# Patient Record
Sex: Female | Born: 1970
Health system: Southern US, Community
[De-identification: ages and names within clinical notes are randomized; demographics above are authoritative.]

## PROBLEM LIST (undated history)

## (undated) DIAGNOSIS — F419 Anxiety disorder, unspecified: Secondary | ICD-10-CM

## (undated) DIAGNOSIS — T7840XA Allergy, unspecified, initial encounter: Secondary | ICD-10-CM

## (undated) DIAGNOSIS — F329 Major depressive disorder, single episode, unspecified: Secondary | ICD-10-CM

## (undated) DIAGNOSIS — Z8619 Personal history of other infectious and parasitic diseases: Secondary | ICD-10-CM

## (undated) DIAGNOSIS — J449 Chronic obstructive pulmonary disease, unspecified: Secondary | ICD-10-CM

## (undated) DIAGNOSIS — F32A Depression, unspecified: Secondary | ICD-10-CM

## (undated) DIAGNOSIS — I10 Essential (primary) hypertension: Secondary | ICD-10-CM

## (undated) HISTORY — DX: Depression, unspecified: F32.A

## (undated) HISTORY — DX: Major depressive disorder, single episode, unspecified: F32.9

## (undated) HISTORY — PX: MYRINGOTOMY: SHX2060

## (undated) HISTORY — DX: Essential (primary) hypertension: I10

## (undated) HISTORY — DX: Anxiety disorder, unspecified: F41.9

## (undated) HISTORY — PX: BREAST ENHANCEMENT SURGERY: SHX7

## (undated) HISTORY — DX: Chronic obstructive pulmonary disease, unspecified: J44.9

## (undated) HISTORY — DX: Allergy, unspecified, initial encounter: T78.40XA

## (undated) HISTORY — PX: AUGMENTATION MAMMAPLASTY: SUR837

---

## 2002-09-02 ENCOUNTER — Emergency Department (HOSPITAL_COMMUNITY): Admission: EM | Admit: 2002-09-02 | Discharge: 2002-09-03 | Payer: Self-pay | Admitting: Emergency Medicine

## 2002-09-05 ENCOUNTER — Emergency Department (HOSPITAL_COMMUNITY): Admission: EM | Admit: 2002-09-05 | Discharge: 2002-09-05 | Payer: Self-pay | Admitting: Emergency Medicine

## 2002-09-10 ENCOUNTER — Emergency Department (HOSPITAL_COMMUNITY): Admission: EM | Admit: 2002-09-10 | Discharge: 2002-09-10 | Payer: Self-pay | Admitting: *Deleted

## 2007-03-13 ENCOUNTER — Inpatient Hospital Stay (HOSPITAL_COMMUNITY): Admission: EM | Admit: 2007-03-13 | Discharge: 2007-03-18 | Payer: Self-pay | Admitting: General Surgery

## 2007-03-13 ENCOUNTER — Encounter: Payer: Self-pay | Admitting: Emergency Medicine

## 2007-07-21 ENCOUNTER — Emergency Department (HOSPITAL_COMMUNITY): Admission: EM | Admit: 2007-07-21 | Discharge: 2007-07-21 | Payer: Self-pay | Admitting: Emergency Medicine

## 2010-06-27 NOTE — H&P (Signed)
[name], [name]                    ACCOUNT NO.:  [id number]   MEDICAL RECORD NO.:  [id number]          PATIENT TYPE:  EMS   LOCATION:  ED                           FACILITY:  [facility]   PHYSICIAN:  Wilmon Arms. Corliss Skains, M.D. DATE OF BIRTH:  10-Jul-1970   DATE OF ADMISSION:  03/12/2007  DATE OF DISCHARGE:                              HISTORY & PHYSICAL   CHIEF COMPLAINT:  Fall.  Splenic laceration.   BRIEF HISTORY:  The patient was initially evaluated at [facility]  emergency department and was emergently transferred to [facility] to the  trauma service.   The patient is a 40 year old female who apparently was out having some  drinks tonight.  She was walking outside when she fell and landed on her  left side.  She developed severe pain in her left posterior chest as  well as her abdomen.  She was evaluated in the emergency department.  She was hemodynamically stable throughout the entire  evaluation.  Her  hemoglobin was 13.8.  However, a CT scan showed hemoperitoneum with a  grade 3/4 splenic laceration.  She is being transferred to [facility]  for further management.   PAST MEDICAL HISTORY:  Hepatitis C.   PAST SURGICAL HISTORY:  C-section, bilateral tubal ligation and  bilateral breast implants.   MEDICATIONS:  Chantix and Wellbutrin.   ALLERGIES:  CODEINE.   SOCIAL HISTORY:  The patient smokes and drinks.   PHYSICAL EXAMINATION:  VITAL SIGNS:  Afebrile.  Heart rate 113, blood  pressure 146/92.  GENERAL:  This is a thin white female who appears uncomfortable.  HEENT:  EOMI.  Sclerae anicteric.  NECK:  No mass or thyromegaly.  LUNGS:  Clear.  Normal respiratory effort.  Tender to palpation in the  left posterior chest.  HEART:  Regular rate and rhythm.  ABDOMEN:  Positive bowel sounds.  Soft.  Minimal tenderness in the left  lateral upper abdomen and in the left flank.  No signs of abrasions.  EXTREMITIES:  No edema.  SKIN:  Warm and dry with no sign of jaundice.   Hemoglobin 13.8, white count 23.  Electrolytes within normal limits.   IMPRESSION:  Grade 3 splenic laceration.  The CT scan does not show any  active extravasation.   PLAN:  Transfer the patient to [facility] to the ICU for nonoperative  management.  We will check serial hemoglobins.  If there is any  significant change then she will need an emergent splenectomy.   ADDENDUM:  While I was coming in to evaluate the patient, apparently the  ED physician had ordered 2 units of packed red blood cells, which were  transfusing when I arrived.      Wilmon Arms. Tsuei, M.D.  Electronically Signed     MKT/MEDQ  D:  03/13/2007  T:  03/13/2007  Job:  [id number]
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
## 2010-07-19 ENCOUNTER — Ambulatory Visit
Admission: RE | Admit: 2010-07-19 | Discharge: 2010-07-19 | Disposition: A | Discharge status: Home / Self Care | Payer: Medicaid Other | Source: Ambulatory Visit | Attending: Specialist | Admitting: Specialist

## 2010-07-19 ENCOUNTER — Other Ambulatory Visit: Payer: Self-pay | Admitting: Specialist

## 2010-07-19 DIAGNOSIS — R200 Anesthesia of skin: Secondary | ICD-10-CM

## 2010-07-19 DIAGNOSIS — M542 Cervicalgia: Secondary | ICD-10-CM

## 2010-09-11 ENCOUNTER — Other Ambulatory Visit: Payer: Self-pay | Admitting: Specialist

## 2010-09-13 ENCOUNTER — Other Ambulatory Visit: Payer: Medicaid Other

## 2010-09-13 ENCOUNTER — Ambulatory Visit
Admission: RE | Admit: 2010-09-13 | Discharge: 2010-09-13 | Disposition: A | Discharge status: Home / Self Care | Payer: Medicaid Other | Source: Ambulatory Visit | Attending: Specialist | Admitting: Specialist

## 2010-11-02 LAB — CROSSMATCH

## 2010-11-02 LAB — CBC
HCT: 33.2 — ABNORMAL LOW
HCT: 33.8 — ABNORMAL LOW
HCT: 36.4
HCT: 39.9
Hemoglobin: 10.8 — ABNORMAL LOW
Hemoglobin: 11.5 — ABNORMAL LOW
Hemoglobin: 11.5 — ABNORMAL LOW
Hemoglobin: 11.6 — ABNORMAL LOW
Hemoglobin: 13.8
MCHC: 34.3
MCHC: 34.6
MCV: 90.4
MCV: 90.5
Platelets: 138 — ABNORMAL LOW
Platelets: 142 — ABNORMAL LOW
Platelets: 147 — ABNORMAL LOW
RBC: 3.4 — ABNORMAL LOW
RBC: 3.67 — ABNORMAL LOW
RDW: 12.4
RDW: 12.6
RDW: 13
WBC: 6.8
WBC: 6.9
WBC: 8.8
WBC: 24.7 — ABNORMAL HIGH

## 2010-11-02 LAB — BASIC METABOLIC PANEL
BUN: 2 — ABNORMAL LOW
CO2: 22
Chloride: 103
Chloride: 109
GFR calc Af Amer: >60
GFR calc non Af Amer: >60
Glucose, Bld: 95
Glucose, Bld: 159 — ABNORMAL HIGH
Potassium: 3.9
Potassium: 4.1
Potassium: 3.7
Sodium: 134 — ABNORMAL LOW
Sodium: 138
Sodium: 139

## 2010-11-02 LAB — RAPID URINE DRUG SCREEN, HOSP PERFORMED
Amphetamines: NOT DETECTED
Barbiturates: NOT DETECTED
Benzodiazepines: NOT DETECTED
Cocaine: NOT DETECTED

## 2010-11-02 LAB — DIFFERENTIAL
Eosinophils Relative: 0
Lymphocytes Relative: 6 — ABNORMAL LOW
Lymphs Abs: 1.6
Monocytes Absolute: 1.4 — ABNORMAL HIGH

## 2010-11-02 LAB — HEPATITIS C VIRUS, RIBA: HCV Antibodies-RIBA: POSITIVE

## 2010-11-02 LAB — ABO/RH: ABO/RH(D): A POS

## 2010-11-02 LAB — PROTIME-INR: Prothrombin Time: 14.4

## 2010-11-03 LAB — CBC
HCT: 34.1 — ABNORMAL LOW
HCT: 35.7 — ABNORMAL LOW
HCT: 38.6
MCV: 90.8
MCV: 91
Platelets: 184
Platelets: 221
RBC: 3.75 — ABNORMAL LOW
RDW: 12.5
WBC: 5.4
WBC: 5.4

## 2010-11-09 LAB — POCT I-STAT, CHEM 8
Calcium, Ion: 1.18
Chloride: 103
HCT: 46
Hemoglobin: 15.6 — ABNORMAL HIGH
TCO2: 26

## 2011-08-02 ENCOUNTER — Encounter: Payer: Self-pay | Admitting: *Deleted

## 2011-08-02 ENCOUNTER — Emergency Department
Admission: EM | Admit: 2011-08-02 | Discharge: 2011-08-02 | Disposition: A | Discharge status: Home / Self Care | Payer: Medicaid Other | Source: Home / Self Care | Attending: Emergency Medicine | Admitting: Emergency Medicine

## 2011-08-02 DIAGNOSIS — J069 Acute upper respiratory infection, unspecified: Secondary | ICD-10-CM

## 2011-08-02 DIAGNOSIS — R509 Fever, unspecified: Secondary | ICD-10-CM

## 2011-08-02 HISTORY — DX: Personal history of other infectious and parasitic diseases: Z86.19

## 2011-08-02 LAB — POCT URINALYSIS DIP (MANUAL ENTRY)
Blood, UA: NEGATIVE
Ketones, POC UA: NEGATIVE
Protein Ur, POC: NEGATIVE
Spec Grav, UA: 1.025 (ref 1.005–1.03)
pH, UA: 6 (ref 5–8)

## 2011-08-02 LAB — POCT RAPID STREP A (OFFICE): Rapid Strep A Screen: NEGATIVE

## 2011-08-02 MED ORDER — AZITHROMYCIN 250 MG PO TABS: ORAL_TABLET | ORAL | Status: AC

## 2011-08-02 NOTE — ED Provider Notes (Signed)
History     CSN: [id number]  Arrival date & time 08/02/11  1637   First MD Initiated Contact with Patient 08/02/11 1639      Chief Complaint  Patient presents with  . Generalized Body Aches  . Headache    (Consider location/radiation/quality/duration/timing/severity/associated sxs/prior treatment) HPI Cassy is a 41 y.o. female who complains of onset of cold symptoms for 2 days.  The symptoms are constant and mild-moderate in severity.  + Sick exposure while at work last week. + sore throat + cough No pleuritic pain No wheezing + nasal congestion + post-nasal drainage + sinus pain/pressure No chest congestion No itchy/red eyes + earache No hemoptysis No SOB + chills/sweats + fever No nausea No vomiting No abdominal pain No diarrhea No skin rashes + fatigue + myalgias + headache  + dysuria   Past Medical History  Diagnosis Date  . History of hepatitis C     Past Surgical History  Procedure Date  . Myringotomy     No family history on file.  History  Substance Use Topics  . Smoking status: Current Everyday Smoker  . Smokeless tobacco: Not on file  . Alcohol Use: No    OB History    Grav Para Term Preterm Abortions TAB SAB Ect Mult Living                  Review of Systems  All other systems reviewed and are negative.    Allergies  Review of patient's allergies indicates no known allergies.  Home Medications   Current Outpatient Rx  Name Route Sig Dispense Refill  . AZITHROMYCIN 250 MG PO TABS  Use as directed 1 each 0    BP 122/86  Pulse 66  Temp 98.2 F (36.8 C) (Oral)  Resp 14  Ht 5\' 1"  (1.549 m)  Wt 114 lb (51.71 kg)  BMI 21.54 kg/m2  SpO2 99%  Physical Exam  Nursing note and vitals reviewed. Constitutional: She is oriented to person, place, and time. She appears well-developed and well-nourished.  Non-toxic appearance. She has a sickly appearance. No distress.  HENT:  Head: Normocephalic and atraumatic.  Right Ear:  Tympanic membrane, external ear and ear canal normal.  Left Ear: Tympanic membrane, external ear and ear canal normal.  Nose: Mucosal edema and rhinorrhea present.  Mouth/Throat: Posterior oropharyngeal erythema present. No oropharyngeal exudate or posterior oropharyngeal edema.  Eyes: No scleral icterus.  Neck: Neck supple.  Cardiovascular: Regular rhythm and normal heart sounds.   Pulmonary/Chest: Effort normal and breath sounds normal. No respiratory distress. She has no decreased breath sounds. She has no wheezes. She has no rhonchi.  Neurological: She is alert and oriented to person, place, and time.  Skin: Skin is warm and dry.  Psychiatric: She has a normal mood and affect. Her speech is normal.    ED Course  Procedures (including critical care time)   Labs Reviewed  POCT RAPID STREP A (OFFICE)  POCT INFLUENZA A/B  POCT URINALYSIS DIP (MANUAL ENTRY)  URINE CULTURE   No results found.   1. Acute upper respiratory infections of unspecified site   2. Fever    Results for orders placed during the hospital encounter of 08/02/11  POCT RAPID STREP A (OFFICE)      Component Value Range   Rapid Strep A Screen Negative  Negative  POCT INFLUENZA A/B      Component Value Range   Influenza A, POC Negative     Influenza B, POC Negative  POCT URINALYSIS DIP (MANUAL ENTRY)      Component Value Range   Color, UA yellow     Clarity, UA clear     Glucose, UA neg     Bilirubin, UA negative     Bilirubin, UA negative     Spec Grav, UA 1.025  1.005 - 1.03   Blood, UA negative     pH, UA 6.0  5 - 8   Protein Ur, POC negative     Urobilinogen, UA 0.2  0 - 1   Nitrite, UA Negative     Leukocytes, UA Negative        MDM  1)  Rapid strep and flu tests are negative.  UA is as above.  UCx pending.  Rx for ABX given, but could hold for a few days since likely still just a viral syndrome. 2)  Use nasal saline solution (over the counter) at least 3 times a day. 3)  Use over the  counter decongestants like Zyrtec-D every 12 hours as needed to help with congestion.  If you have hypertension, do not take medicines with sudafed.  4)  Can take tylenol every 6 hours or motrin every 8 hours for pain or fever. 5)  Follow up with your primary doctor if no improvement in 5-7 days, sooner if increasing pain, fever, or new symptoms.         Marlaine Hind, MD 08/02/11 [phone]

## 2011-08-02 NOTE — ED Notes (Signed)
Patient c/o bodyaches, HA, cough (productive at times) and runny nose x 2 days.

## 2011-08-05 ENCOUNTER — Telehealth: Payer: Self-pay

## 2011-08-05 NOTE — ED Notes (Signed)
Left a message on voice mail asking how patient is feeling and advising to call back with any questions or concerns.  

## 2012-06-25 ENCOUNTER — Other Ambulatory Visit: Payer: Self-pay | Admitting: Obstetrics & Gynecology

## 2012-07-24 ENCOUNTER — Encounter: Payer: Self-pay | Admitting: Obstetrics & Gynecology

## 2012-12-01 ENCOUNTER — Emergency Department (HOSPITAL_COMMUNITY): Payer: Medicaid Other

## 2012-12-01 ENCOUNTER — Emergency Department (HOSPITAL_COMMUNITY)
Admission: EM | Admit: 2012-12-01 | Discharge: 2012-12-02 | Disposition: A | Discharge status: Home / Self Care | Payer: Medicaid Other | Attending: Emergency Medicine | Admitting: Emergency Medicine

## 2012-12-01 ENCOUNTER — Encounter (HOSPITAL_COMMUNITY): Payer: Self-pay | Admitting: Emergency Medicine

## 2012-12-01 DIAGNOSIS — F17200 Nicotine dependence, unspecified, uncomplicated: Secondary | ICD-10-CM | POA: Insufficient documentation

## 2012-12-01 DIAGNOSIS — N201 Calculus of ureter: Secondary | ICD-10-CM | POA: Insufficient documentation

## 2012-12-01 DIAGNOSIS — Z8619 Personal history of other infectious and parasitic diseases: Secondary | ICD-10-CM | POA: Insufficient documentation

## 2012-12-01 DIAGNOSIS — Z3202 Encounter for pregnancy test, result negative: Secondary | ICD-10-CM | POA: Insufficient documentation

## 2012-12-01 LAB — LIPASE, BLOOD: Lipase: 35 U/L (ref 11–59)

## 2012-12-01 LAB — URINALYSIS, ROUTINE W REFLEX MICROSCOPIC
Bilirubin Urine: NEGATIVE
Glucose, UA: NEGATIVE mg/dL
Hgb urine dipstick: NEGATIVE
Ketones, ur: 15 mg/dL — AB
Leukocytes, UA: NEGATIVE
Nitrite: NEGATIVE
Protein, ur: NEGATIVE mg/dL
Specific Gravity, Urine: 1.03 (ref 1.005–1.030)
Urobilinogen, UA: 0.2 mg/dL (ref 0.0–1.0)
pH: 5.5 (ref 5.0–8.0)

## 2012-12-01 LAB — URINE MICROSCOPIC-ADD ON

## 2012-12-01 LAB — CBC WITH DIFFERENTIAL/PLATELET
Basophils Absolute: 0 K/uL (ref 0.0–0.1)
Basophils Relative: 0 % (ref 0–1)
Eosinophils Absolute: 0 10*3/uL (ref 0.0–0.7)
Eosinophils Relative: 0 % (ref 0–5)
HCT: 43.1 % (ref 36.0–46.0)
Hemoglobin: 14.4 g/dL (ref 12.0–15.0)
Lymphocytes Relative: 35 % (ref 12–46)
Lymphs Abs: 2.8 10*3/uL (ref 0.7–4.0)
MCH: 29.6 pg (ref 26.0–34.0)
MCHC: 33.4 g/dL (ref 30.0–36.0)
MCV: 88.5 fL (ref 78.0–100.0)
Monocytes Absolute: 0.6 K/uL (ref 0.1–1.0)
Monocytes Relative: 7 % (ref 3–12)
Neutro Abs: 4.5 K/uL (ref 1.7–7.7)
Neutrophils Relative %: 57 % (ref 43–77)
Platelets: 254 10*3/uL (ref 150–400)
RBC: 4.87 MIL/uL (ref 3.87–5.11)
RDW: 12.8 % (ref 11.5–15.5)
WBC: 7.9 K/uL (ref 4.0–10.5)

## 2012-12-01 LAB — COMPREHENSIVE METABOLIC PANEL WITH GFR
Calcium: 9.6 mg/dL (ref 8.4–10.5)
Chloride: 105 meq/L (ref 96–112)
Creatinine, Ser: 0.84 mg/dL (ref 0.50–1.10)
GFR calc Af Amer: >90 mL/min (ref >90)
GFR calc non Af Amer: 85 mL/min — ABNORMAL LOW (ref >90)
Glucose, Bld: 106 mg/dL — ABNORMAL HIGH (ref 70–99)
Total Bilirubin: 0.3 mg/dL (ref 0.3–1.2)

## 2012-12-01 LAB — COMPREHENSIVE METABOLIC PANEL
ALT: 11 U/L (ref 0–35)
AST: 17 U/L (ref 0–37)
Albumin: 4.1 g/dL (ref 3.5–5.2)
Alkaline Phosphatase: 67 U/L (ref 39–117)
BUN: 11 mg/dL (ref 6–23)
CO2: 25 mEq/L (ref 19–32)
Potassium: 4.1 mEq/L (ref 3.5–5.1)
Sodium: 142 mEq/L (ref 135–145)
Total Protein: 7.9 g/dL (ref 6.0–8.3)

## 2012-12-01 LAB — POCT PREGNANCY, URINE: Preg Test, Ur: NEGATIVE

## 2012-12-01 MED ORDER — OXYCODONE-ACETAMINOPHEN 5-325 MG PO TABS
1.0000 | ORAL_TABLET | Freq: Once | ORAL | Status: AC
  Administered 2012-12-01: 1 via ORAL
  Filled 2012-12-01: qty 1

## 2012-12-01 MED ORDER — ONDANSETRON 4 MG PO TBDP
4.0000 mg | ORAL_TABLET | Freq: Three times a day (TID) | ORAL | Status: DC | PRN
Start: 2012-12-01 — End: 2013-08-09

## 2012-12-01 MED ORDER — HYDROMORPHONE HCL PF 1 MG/ML IJ SOLN
INTRAMUSCULAR | Status: AC
  Filled 2012-12-01: qty 1

## 2012-12-01 MED ORDER — ONDANSETRON HCL 4 MG/2ML IJ SOLN
4.0000 mg | Freq: Once | INTRAMUSCULAR | Status: AC
  Administered 2012-12-01: 4 mg via INTRAVENOUS

## 2012-12-01 MED ORDER — FENTANYL CITRATE 0.05 MG/ML IJ SOLN
50.0000 ug | Freq: Once | INTRAMUSCULAR | Status: AC
  Administered 2012-12-01: 50 ug via INTRAVENOUS

## 2012-12-01 MED ORDER — HYDROMORPHONE HCL PF 1 MG/ML IJ SOLN
0.5000 mg | Freq: Once | INTRAMUSCULAR | Status: AC
  Administered 2012-12-01: 0.5 mg via INTRAVENOUS
  Filled 2012-12-01: qty 1

## 2012-12-01 MED ORDER — OXYCODONE-ACETAMINOPHEN 5-325 MG PO TABS: 1.0000 | ORAL_TABLET | Freq: Four times a day (QID) | ORAL | Status: DC | PRN

## 2012-12-01 MED ORDER — KETOROLAC TROMETHAMINE 30 MG/ML IJ SOLN
30.0000 mg | Freq: Once | INTRAMUSCULAR | Status: AC
  Administered 2012-12-01: 30 mg via INTRAVENOUS
  Filled 2012-12-01: qty 1

## 2012-12-01 MED ORDER — HYDROMORPHONE HCL PF 1 MG/ML IJ SOLN
1.0000 mg | Freq: Once | INTRAMUSCULAR | Status: AC
  Administered 2012-12-01: 1 mg via INTRAVENOUS
  Administered 2012-12-01: 23:00:00 via INTRAVENOUS

## 2012-12-01 NOTE — ED Notes (Signed)
Patient transported to CT 

## 2012-12-01 NOTE — ED Notes (Signed)
MD in room

## 2012-12-01 NOTE — ED Notes (Signed)
Pt complaining on right flank pain radiating to her back for three days

## 2012-12-01 NOTE — ED Provider Notes (Signed)
CSN: [id number]     Arrival date & time 12/01/12  1945 History   First MD Initiated Contact with Patient 12/01/12 2042     Chief Complaint  Patient presents with  . Flank Pain   (Consider location/radiation/quality/duration/timing/severity/associated sxs/prior Treatment) Patient is a 42 y.o. female presenting with abdominal pain. The history is provided by the patient.  Abdominal Pain Pain location:  R flank Pain quality: sharp and stabbing   Pain radiates to:  Suprapubic region Pain severity:  Severe Onset quality:  Gradual Timing:  Intermittent Progression:  Waxing and waning Chronicity:  New Relieved by:  Nothing Worsened by:  Movement, palpation and urination Associated symptoms: dysuria   Associated symptoms: no anorexia, no chills, no constipation, no cough, no diarrhea, no fatigue, no fever, no hematemesis, no hematuria, no nausea, no shortness of breath, no vaginal bleeding, no vaginal discharge and no vomiting     Past Medical History  Diagnosis Date  . History of hepatitis C    Past Surgical History  Procedure Laterality Date  . Myringotomy     No family history on file. History  Substance Use Topics  . Smoking status: Current Every Day Smoker    Types: Cigarettes  . Smokeless tobacco: Not on file  . Alcohol Use: No   OB History   Grav Para Term Preterm Abortions TAB SAB Ect Mult Living                 Review of Systems  Constitutional: Negative for fever, chills and fatigue.  Respiratory: Negative for cough and shortness of breath.   Gastrointestinal: Positive for abdominal pain. Negative for nausea, vomiting, diarrhea, constipation, anorexia and hematemesis.  Genitourinary: Positive for dysuria and flank pain. Negative for frequency, hematuria, vaginal bleeding, vaginal discharge, difficulty urinating and genital sores.  Musculoskeletal: Negative for arthralgias, myalgias, neck pain and neck stiffness.  Skin: Negative for color change, pallor, rash and  wound.  All other systems reviewed and are negative.    Allergies  Review of patient's allergies indicates no known allergies.  Home Medications   Current Outpatient Rx  Name  Route  Sig  Dispense  Refill  . ondansetron (ZOFRAN ODT) 4 MG disintegrating tablet   Oral   Take 1 tablet (4 mg total) by mouth every 8 (eight) hours as needed for nausea.   10 tablet   0   . oxyCODONE-acetaminophen (PERCOCET/ROXICET) 5-325 MG per tablet   Oral   Take 1-2 tablets by mouth every 6 (six) hours as needed for pain.   30 tablet   0    BP 150/71  Pulse 55  Temp(Src) 98.9 F (37.2 C) (Oral)  Resp 24  Ht 5\' 1"  (1.549 m)  Wt 115 lb (52.164 kg)  BMI 21.74 kg/m2  SpO2 99%  LMP 11/11/2012 Physical Exam  Nursing note and vitals reviewed. Constitutional: She is oriented to person, place, and time. She appears well-developed and well-nourished. She appears distressed.  Uncomfortable appearing female in obvious pain  HENT:  Head: Normocephalic and atraumatic.  Mouth/Throat: Oropharynx is clear and moist. No oropharyngeal exudate.  Eyes: Conjunctivae and EOM are normal. Pupils are equal, round, and reactive to light.  Neck: Normal range of motion. Neck supple.  Cardiovascular: Normal rate, regular rhythm and normal heart sounds.  Exam reveals no gallop and no friction rub.   No murmur heard. Pulmonary/Chest: Effort normal and breath sounds normal. No respiratory distress. She has no wheezes. She has no rales. She exhibits no tenderness.  Abdominal: Soft. She exhibits no distension. There is no hepatosplenomegaly. There is tenderness (and right flank) in the right lower quadrant. There is CVA tenderness (R). There is no rigidity, no rebound, no guarding, no tenderness at McBurney's point and negative Murphy's sign.  Musculoskeletal: Normal range of motion. She exhibits no edema and no tenderness.  Lymphadenopathy:    She has no cervical adenopathy.  Neurological: She is alert and oriented to  person, place, and time.  Skin: Skin is warm and dry. No rash noted. She is not diaphoretic.  Psychiatric: She has a normal mood and affect. Her behavior is normal. Judgment and thought content normal.    ED Course  Procedures (including critical care time) Labs Review Labs Reviewed  COMPREHENSIVE METABOLIC PANEL - Abnormal; Notable for the following:    Glucose, Bld 106 (*)    GFR calc non Af Amer 85 (*)    All other components within normal limits  URINALYSIS, ROUTINE W REFLEX MICROSCOPIC - Abnormal; Notable for the following:    Color, Urine AMBER (*)    APPearance TURBID (*)    Ketones, ur 15 (*)    All other components within normal limits  URINE MICROSCOPIC-ADD ON - Abnormal; Notable for the following:    Squamous Epithelial / LPF MANY (*)    Crystals CA OXALATE CRYSTALS (*)    All other components within normal limits  CBC WITH DIFFERENTIAL  LIPASE, BLOOD  POCT PREGNANCY, URINE   Imaging Review Ct Abdomen Pelvis Wo Contrast  12/01/2012   CLINICAL DATA:  Right-sided flank pain radiating to the back for 3 days  EXAM: CT ABDOMEN AND PELVIS WITHOUT CONTRAST  TECHNIQUE: Multidetector CT imaging of the abdomen and pelvis was performed following the standard protocol without intravenous contrast.  COMPARISON:  07/21/2007  FINDINGS: Breast implants partly visualized. Motion artifact noted at the lung bases. Lung bases are clear. No pleural effusion.  Right-sided extrarenal pelvis is reidentified. There is new moderate right hydroureteronephrosis to the level of the pelvis. The distal right ureter is not well visualized due to lack of contrast, adjacent loops of unopacified bowel, and paucity of fat, however there is a new 2 mm radiopacity within the right pelvis image 64 that could correspond to the course of the distal right ureter and could represent a radiopaque calculus. No similar finding was seen in this area on the prior exam. There is also a 2 mm radiopacity in the right lateral  pelvis image 66, however this is felt to more likely represent a pelvic phlebolith due to its location.  Unenhanced liver, spleen, gallbladder, left kidney, adrenal glands, and pancreas are normal. No ascites or lymphadenopathy. The bowel is unremarkable. The appendix is not identified but there is no secondary evidence for acute appendicitis. Uterus is ovaries are normal. No acute osseous abnormality.  IMPRESSION: 2 mm probable right distal ureteral calculus image 64, with moderate proximal right hydroureteronephrosis. This is difficult to distinguish from adjacent probable phleboliths due to incomplete bladder decompression and lack of contrast, however there is no abnormal radiopacity greater than 2 mm in the pelvis, and a calculus of this size would most likely pass spontaneously.  No other abnormality is seen to account for the patient's right-sided flank pain.   Electronically Signed   By: Christiana Pellant M.D.   On: 12/01/2012 22:02    EKG Interpretation   None       MDM   1. Ureterolithiasis     42 year old female with no significant  past medical history who presents with 3 days of worsening right flank and right lower quadrant abdominal pain. Initially, patient felt this was likely a urinary tract infection and she took some antibiotics that she had leftover from a previous urinary tract infection. Pain did not resolve, so she decided to presents today. Has not taken anything for pain home. Denies fever, nausea, vomiting, vaginal bleeding, vaginal discharge, vaginal pain. No history of kidney stones.  Tenderness in the right flank, right CVA area. Waxing and waning pain radiating to the right groin typical of nephrolithiasis. Abdominal exam benign and no signs of obstruction appendicitis, torsion, TOA. Urinalysis obtained in triage shows Ca oxalate crystals but no signs of infection. No blood and no signs of infection. Normal white count and normal renal function. Urine pregnancy test  negative. Based on these findings, CT noncontrasted scan obtained to evaluate for stone and location and size. 2 mm stone found in the distal right ureter. Based on location and size, so will likely pass on its. Pain treated multiple different times in the emergency department with narcotics as well as Toradol. Patient will go home with Percocet, Zofran, urine strainer. She will be referred to urology as an outpatient for further evaluation and treatment.  Discussed with the patient return precautions and need for follow up with Urology. Patient voiced understanding. Stable for d/c. This patient was discussed with my attending, Dr. Oletta Lamas.   Dorna Leitz, MD 12/02/12 Marlyne Beards

## 2012-12-01 NOTE — ED Notes (Signed)
MD at bedside. 

## 2012-12-02 NOTE — ED Provider Notes (Signed)
I saw and evaluated the patient, reviewed the resident's note and I agree with the findings and plan.  Pt with acute flank pain, CT shows ureteral stone. Symptoms required multiple doses of analgesics to be comfortable.  No signs of infection.  HCG neg.  Pt can follow up with urology if needed, given 2 mm size, had made its way to distal ureteral, likely will pass on its own.    Gavin Pound. Phiona Ramnauth, MD 12/02/12 0005

## 2013-08-09 ENCOUNTER — Encounter (HOSPITAL_COMMUNITY): Payer: Self-pay | Admitting: Emergency Medicine

## 2013-08-09 ENCOUNTER — Emergency Department (HOSPITAL_COMMUNITY)
Admission: EM | Admit: 2013-08-09 | Discharge: 2013-08-10 | Disposition: A | Discharge status: Home / Self Care | Payer: No Typology Code available for payment source | Attending: Emergency Medicine | Admitting: Emergency Medicine

## 2013-08-09 DIAGNOSIS — S0993XA Unspecified injury of face, initial encounter: Secondary | ICD-10-CM | POA: Insufficient documentation

## 2013-08-09 DIAGNOSIS — Z3202 Encounter for pregnancy test, result negative: Secondary | ICD-10-CM | POA: Diagnosis not present

## 2013-08-09 DIAGNOSIS — S79919A Unspecified injury of unspecified hip, initial encounter: Secondary | ICD-10-CM | POA: Diagnosis not present

## 2013-08-09 DIAGNOSIS — R059 Cough, unspecified: Secondary | ICD-10-CM

## 2013-08-09 DIAGNOSIS — Y92410 Unspecified street and highway as the place of occurrence of the external cause: Secondary | ICD-10-CM | POA: Diagnosis not present

## 2013-08-09 DIAGNOSIS — S0990XA Unspecified injury of head, initial encounter: Secondary | ICD-10-CM | POA: Insufficient documentation

## 2013-08-09 DIAGNOSIS — N739 Female pelvic inflammatory disease, unspecified: Secondary | ICD-10-CM | POA: Insufficient documentation

## 2013-08-09 DIAGNOSIS — S79929A Unspecified injury of unspecified thigh, initial encounter: Secondary | ICD-10-CM

## 2013-08-09 DIAGNOSIS — R05 Cough: Secondary | ICD-10-CM | POA: Diagnosis not present

## 2013-08-09 DIAGNOSIS — Y9389 Activity, other specified: Secondary | ICD-10-CM | POA: Diagnosis not present

## 2013-08-09 DIAGNOSIS — S199XXA Unspecified injury of neck, initial encounter: Secondary | ICD-10-CM

## 2013-08-09 DIAGNOSIS — Z8619 Personal history of other infectious and parasitic diseases: Secondary | ICD-10-CM | POA: Diagnosis not present

## 2013-08-09 DIAGNOSIS — N898 Other specified noninflammatory disorders of vagina: Secondary | ICD-10-CM | POA: Diagnosis present

## 2013-08-09 DIAGNOSIS — M545 Low back pain, unspecified: Secondary | ICD-10-CM

## 2013-08-09 DIAGNOSIS — F17200 Nicotine dependence, unspecified, uncomplicated: Secondary | ICD-10-CM | POA: Diagnosis not present

## 2013-08-09 DIAGNOSIS — IMO0002 Reserved for concepts with insufficient information to code with codable children: Secondary | ICD-10-CM | POA: Insufficient documentation

## 2013-08-09 DIAGNOSIS — N730 Acute parametritis and pelvic cellulitis: Secondary | ICD-10-CM

## 2013-08-09 DIAGNOSIS — M25552 Pain in left hip: Secondary | ICD-10-CM

## 2013-08-09 DIAGNOSIS — R51 Headache: Secondary | ICD-10-CM

## 2013-08-09 DIAGNOSIS — R519 Headache, unspecified: Secondary | ICD-10-CM

## 2013-08-09 MED ORDER — OXYCODONE-ACETAMINOPHEN 5-325 MG PO TABS
1.0000 | ORAL_TABLET | Freq: Once | ORAL | Status: DC
  Filled 2013-08-09: qty 1

## 2013-08-09 NOTE — ED Notes (Signed)
Pt states she has been having abnormal periods and states she has noticed that she has a vaginal odor and would like to have that checked while she is here
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
## 2013-08-10 ENCOUNTER — Emergency Department (HOSPITAL_COMMUNITY): Payer: No Typology Code available for payment source

## 2013-08-10 DIAGNOSIS — S0990XA Unspecified injury of head, initial encounter: Secondary | ICD-10-CM | POA: Diagnosis not present

## 2013-08-10 LAB — WET PREP, GENITAL
Trich, Wet Prep: NONE SEEN
Yeast Wet Prep HPF POC: NONE SEEN

## 2013-08-10 LAB — URINALYSIS, ROUTINE W REFLEX MICROSCOPIC
BILIRUBIN URINE: NEGATIVE
Glucose, UA: NEGATIVE mg/dL
Hgb urine dipstick: NEGATIVE
KETONES UR: NEGATIVE mg/dL
Nitrite: NEGATIVE
Protein, ur: NEGATIVE mg/dL
Specific Gravity, Urine: 1.016 (ref 1.005–1.030)
UROBILINOGEN UA: 1 mg/dL (ref 0.0–1.0)
pH: 6.5 (ref 5.0–8.0)

## 2013-08-10 LAB — RPR

## 2013-08-10 LAB — PREGNANCY, URINE: Preg Test, Ur: NEGATIVE

## 2013-08-10 LAB — URINE MICROSCOPIC-ADD ON

## 2013-08-10 LAB — GC/CHLAMYDIA PROBE AMP
CT PROBE, AMP APTIMA: NEGATIVE
GC PROBE AMP APTIMA: NEGATIVE

## 2013-08-10 LAB — HIV ANTIBODY (ROUTINE TESTING W REFLEX): HIV 1&2 Ab, 4th Generation: NONREACTIVE

## 2013-08-10 MED ORDER — DOXYCYCLINE HYCLATE 100 MG PO CAPS: 100.0000 mg | ORAL_CAPSULE | Freq: Two times a day (BID) | ORAL | Status: DC

## 2013-08-10 MED ORDER — METHOCARBAMOL 500 MG PO TABS
1000.0000 mg | ORAL_TABLET | Freq: Once | ORAL | Status: AC
  Administered 2013-08-10: 1000 mg via ORAL
  Filled 2013-08-10: qty 2

## 2013-08-10 MED ORDER — AZITHROMYCIN 1 G PO PACK
1.0000 g | PACK | Freq: Once | ORAL | Status: AC
  Administered 2013-08-10: 1 g via ORAL
  Filled 2013-08-10: qty 1

## 2013-08-10 MED ORDER — NAPROXEN 500 MG PO TABS: 500.0000 mg | ORAL_TABLET | Freq: Two times a day (BID) | ORAL | Status: DC

## 2013-08-10 MED ORDER — DEXTROSE 5 % IV SOLN
250.0000 mg | Freq: Once | INTRAVENOUS | Status: AC
Start: 2013-08-10 — End: 2013-08-10
  Administered 2013-08-10: 250 mg via INTRAVENOUS
  Filled 2013-08-10: qty 250

## 2013-08-10 MED ORDER — METHOCARBAMOL 750 MG PO TABS: 750.0000 mg | ORAL_TABLET | Freq: Four times a day (QID) | ORAL | Status: DC

## 2013-08-10 MED ORDER — ONDANSETRON 8 MG PO TBDP: 8.0000 mg | ORAL_TABLET | Freq: Three times a day (TID) | ORAL | Status: DC | PRN

## 2013-08-10 MED ORDER — FENTANYL CITRATE 0.05 MG/ML IJ SOLN
50.0000 ug | Freq: Once | INTRAMUSCULAR | Status: AC
  Administered 2013-08-10: 50 ug via INTRAVENOUS
  Filled 2013-08-10: qty 2
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
## 2013-08-10 NOTE — Discharge Instructions (Signed)
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 is muscle and boney aches and pains. These pains can occur in any part of the body. Your caregiver may treat you without knowing the cause of the pain. They may treat you if blood or urine tests, X-rays, and other tests were normal.  CAUSES There is often not a definite cause or reason for these pains. These pains may be caused by a type of germ (virus). The discomfort may also come from overuse. Overuse includes working out too hard when your body is not fit. Boney aches also come from weather changes. Bone is sensitive to atmospheric pressure changes. HOME CARE INSTRUCTIONS   Ask when your test results will be ready. Make sure you get your test results.  Only take over-the-counter or prescription medicines for pain, discomfort, or fever as directed by your caregiver. If you were given medications for your condition, do not drive, operate machinery or power tools, or sign legal  documents for 24 hours. Do not drink alcohol. Do not take sleeping pills or other medications that may interfere with treatment.  Continue all activities unless the activities cause more pain. When the pain lessens, slowly resume normal activities. Gradually increase the intensity and duration of the activities or exercise.  During periods of severe pain, bed rest may be helpful. Lay or sit in any position that is comfortable.  Putting ice on the injured area.  Put ice in a bag.  Place a towel between your skin and the bag.  Leave the ice on for 15 to 20 minutes, 3 to 4 times a day.  Follow up with your caregiver for continued problems and no reason can be found for the pain. If the pain becomes worse or does not go away, it may be necessary to repeat tests or do additional testing. Your caregiver may need to look further for a possible cause. SEEK IMMEDIATE MEDICAL CARE IF:  You have pain that is getting worse and is not relieved by medications.  You develop chest pain that is associated with shortness or breath, sweating, feeling sick to your stomach (nauseous), or throw up (vomit).  Your pain becomes localized to the abdomen.  You develop any new symptoms that seem different or that concern you. MAKE SURE YOU:   Understand these instructions.  Will watch your condition.  Will get help right away if you are not doing well or get worse. Document Released: 01/29/2005 Document Revised: 04/23/2011 Document Reviewed: 10/03/2012 [facility] Patient Information 2015 [place], [place]. This information is not intended to replace advice given to you by your health care provider. Make sure you discuss any questions you have with your health care provider.  Pelvic Inflammatory Disease Pelvic inflammatory disease (PID) refers to an infection in some or all of the female organs. The infection can be in the uterus, ovaries, fallopian tubes,  or the surrounding tissues in the pelvis. PID can cause  abdominal or pelvic pain that comes on suddenly (acute pelvic pain). PID is a serious infection because it can lead to lasting (chronic) pelvic pain or the inability to have children (infertile).  CAUSES  The infection is often caused by the normal bacteria found in the vaginal tissues. PID may also be caused by an infection that is spread during sexual contact. PID can also occur following:   The birth of a baby.   A miscarriage.   An abortion.   Major pelvic surgery.   The use of an intrauterine device (IUD).   A sexual assault.  RISK FACTORS Certain factors can put a person at higher risk for PID, such as:  Being younger than 25 years.  Being sexually active at [place] age.  Usingnonbarrier contraception.  Havingmultiple sexual partners.  Having sex with someone who has symptoms of a genital infection.  Using oral contraception. Other times, certain behaviors can increase the possibility of getting PID, such as:  Having sex during your period.  Using a vaginal douche.  Having an intrauterine device (IUD) in place. SYMPTOMS   Abdominal or pelvic pain.   Fever.   Chills.   Abnormal vaginal discharge.  Abnormal uterine bleeding.   Unusual pain shortly after finishing your period. DIAGNOSIS  Your caregiver will choose some of the following methods to make a diagnosis, such as:   Performinga physical exam and history. A pelvic exam typically reveals a very tender uterus and surrounding pelvis.   Ordering laboratory tests including a pregnancy test, blood tests, and urine test.  Orderingcultures of the vagina and cervix to check for a sexually transmitted infection (STI).  Performing an ultrasound.   Performing a laparoscopic procedure to look inside the pelvis.  TREATMENT   Antibiotic medicines may be prescribed and taken by mouth.   Sexual partners may be treated when the infection is caused by a sexually transmitted disease (STD).    Hospitalization may be needed to give antibiotics intravenously.  Surgery may be needed, but this is rare. It may take weeks until you are completely well. If you are diagnosed with PID, you should also be checked for human immunodeficiency virus (HIV). HOME CARE INSTRUCTIONS   If given, take your antibiotics as directed. Finish the medicine even if you start to feel better.   Only take over-the-counter or prescription medicines for pain, discomfort, or fever as directed by your caregiver.   Do not have sexual intercourse until treatment is completed or as directed by your caregiver. If PID is confirmed, your recent sexual partner(s) will need treatment.   Keep your follow-up appointments. SEEK MEDICAL CARE IF:   You have increased or abnormal vaginal discharge.   You need prescription medicine for your pain.   You vomit.   You cannot take your medicines.   Your partner has an STD.  SEEK IMMEDIATE MEDICAL CARE IF:   You have a fever.   You have increased abdominal or pelvic pain.   You have chills.   You have pain when you urinate.   You are not better after 72 hours following treatment.  MAKE SURE YOU:   Understand these instructions.  Will watch your condition.  Will get help right away if you are not doing well or get worse. Document Released: 01/29/2005 Document Revised: 05/26/2012 Document Reviewed: 01/25/2011 [facility] Patient Information 2015 [place], [place]. This information is not intended to replace advice given to you by your  health care provider. Make sure you discuss any questions you have with your health care provider.

## 2013-10-12 ENCOUNTER — Emergency Department (HOSPITAL_COMMUNITY)
Admission: EM | Admit: 2013-10-12 | Discharge: 2013-10-12 | Disposition: A | Discharge status: Home / Self Care | Payer: Medicaid Other | Attending: Emergency Medicine | Admitting: Emergency Medicine

## 2013-10-12 ENCOUNTER — Encounter (HOSPITAL_COMMUNITY): Payer: Self-pay | Admitting: Emergency Medicine

## 2013-10-12 DIAGNOSIS — F17200 Nicotine dependence, unspecified, uncomplicated: Secondary | ICD-10-CM | POA: Insufficient documentation

## 2013-10-12 DIAGNOSIS — M5430 Sciatica, unspecified side: Secondary | ICD-10-CM | POA: Insufficient documentation

## 2013-10-12 DIAGNOSIS — Z792 Long term (current) use of antibiotics: Secondary | ICD-10-CM | POA: Insufficient documentation

## 2013-10-12 DIAGNOSIS — M549 Dorsalgia, unspecified: Secondary | ICD-10-CM | POA: Diagnosis present

## 2013-10-12 DIAGNOSIS — M5442 Lumbago with sciatica, left side: Secondary | ICD-10-CM

## 2013-10-12 DIAGNOSIS — Z791 Long term (current) use of non-steroidal anti-inflammatories (NSAID): Secondary | ICD-10-CM | POA: Insufficient documentation

## 2013-10-12 DIAGNOSIS — Z8619 Personal history of other infectious and parasitic diseases: Secondary | ICD-10-CM | POA: Diagnosis not present

## 2013-10-12 MED ORDER — OXYCODONE HCL 5 MG PO TABS
5.0000 mg | ORAL_TABLET | Freq: Once | ORAL | Status: AC
  Administered 2013-10-12: 5 mg via ORAL
  Filled 2013-10-12: qty 1

## 2013-10-12 MED ORDER — METHOCARBAMOL 500 MG PO TABS: 1000.0000 mg | ORAL_TABLET | Freq: Four times a day (QID) | ORAL | Status: DC

## 2013-10-12 MED ORDER — NAPROXEN 500 MG PO TABS: 500.0000 mg | ORAL_TABLET | Freq: Two times a day (BID) | ORAL | Status: DC

## 2013-10-12 MED ORDER — OXYCODONE HCL 5 MG PO TABS: 5.0000 mg | ORAL_TABLET | ORAL | Status: DC | PRN

## 2013-10-12 MED ORDER — METHOCARBAMOL 500 MG PO TABS
500.0000 mg | ORAL_TABLET | Freq: Once | ORAL | Status: AC
  Administered 2013-10-12: 500 mg via ORAL
  Filled 2013-10-12: qty 1

## 2013-10-12 NOTE — ED Provider Notes (Signed)
CSN: [id number]     Arrival date & time 10/12/13  1711 History  This chart was scribed for non-physician practitioner working with Ward Givens, MD by Elveria Rising, ED Scribe. This patient was seen in room WTR7/WTR7 and the patient's care was started at 6:26 PM.   Chief Complaint  Patient presents with  . Back Pain     The history is provided by the patient. No language interpreter was used.   HPI Comments: [name] is a 43 y.o. female with history of back pain who presents to the Emergency Department complaining of back pain, ongoing for three days. Pain presented after bending over to pick up her grandson Friday. Patient reports experiencing sharp pain in her lower back when attempting to stand back up and constant throbbing, aching pain since. She reports radiation throughout her left leg with tingling throughout the limb. She reports exacerbation with ambulation and lying down, stating that she can't find a comfortable position. Patient denies experiencing similar back pain with left sciatica. Patient shares history of herniated cervical disc and lower back issues (unspecified).  Patient is ambulatory, with pain.   Patient denies fever, chill, unexpected weight loss, or numbness in her groin. She denies history of IV drug use.   Past Medical History  Diagnosis Date  . History of hepatitis C    Past Surgical History  Procedure Laterality Date  . Myringotomy    . Cesarean section    . Breast enhancement surgery     Family History  Problem Relation Age of Onset  . Cancer Other    History  Substance Use Topics  . Smoking status: Current Every Day Smoker    Types: Cigarettes  . Smokeless tobacco: Not on file  . Alcohol Use: Yes     Comment: occ   OB History   Grav Para Term Preterm Abortions TAB SAB Ect Mult Living                 Review of Systems  Constitutional: Negative for fever, chills and unexpected weight change.  Gastrointestinal: Negative for constipation.        Negative for fecal incontinence.   Genitourinary: Negative for dysuria, hematuria, flank pain, vaginal bleeding, vaginal discharge and pelvic pain.       Negative for urinary incontinence or retention.  Musculoskeletal: Positive for back pain. Negative for gait problem.  Neurological: Negative for weakness and numbness.       Denies saddle paresthesias.      Allergies  Tylenol  Home Medications   Prior to Admission medications   Medication Sig Start Date End Date Taking? Authorizing Provider  doxycycline (VIBRAMYCIN) 100 MG capsule Take 1 capsule (100 mg total) by mouth 2 (two) times daily. 08/10/13   Olivia Mackie, MD  methocarbamol (ROBAXIN-750) 750 MG tablet Take 1 tablet (750 mg total) by mouth 4 (four) times daily. 08/10/13   Olivia Mackie, MD  naproxen (NAPROSYN) 500 MG tablet Take 1 tablet (500 mg total) by mouth 2 (two) times daily. 08/10/13   Olivia Mackie, MD  ondansetron (ZOFRAN ODT) 8 MG disintegrating tablet Take 1 tablet (8 mg total) by mouth every 8 (eight) hours as needed for nausea or vomiting. 08/10/13   Olivia Mackie, MD   Triage Vitals: BP 109/78  Pulse 89  Temp(Src) 98.1 F (36.7 C) (Oral)  Resp 18  SpO2 97%  LMP 10/09/2013  Physical Exam  Nursing note and vitals reviewed. Constitutional: She appears well-developed and  well-nourished.  HENT:  Head: Normocephalic and atraumatic.  Eyes: Conjunctivae and EOM are normal.  Neck: Normal range of motion. Neck supple.  Cardiovascular: Normal rate.   Pulmonary/Chest: Effort normal.  Abdominal: Soft. There is no tenderness. There is no CVA tenderness.  Musculoskeletal: Normal range of motion.       Right hip: Normal.       Left hip: Normal.       Cervical back: She exhibits normal range of motion, no tenderness and no bony tenderness.       Thoracic back: She exhibits normal range of motion, no tenderness and no bony tenderness.       Lumbar back: She exhibits tenderness. She exhibits normal range of motion and no bony  tenderness.       Back:  No step-off noted with palpation of spine.   Neurological: She is alert. She has normal strength and normal reflexes. No sensory deficit.  5/5 strength in entire lower extremities bilaterally. No sensation deficit.   Skin: Skin is warm and dry. No rash noted.  Psychiatric: She has a normal mood and affect. Her behavior is normal.    ED Course  Procedures (including critical care time)  COORDINATION OF CARE: 6:34 PM- Pain management discussed. Patient advised to follow with PCP for long term treatment. Discussed treatment plan with patient at bedside and patient agreed to plan.   Labs Review Labs Reviewed - No data to display  Imaging Review No results found.   EKG Interpretation None      Patient seen and examined. Medications ordered.   Vital signs reviewed and are as follows: Filed Vitals:   10/12/13 1724  BP: 109/78  Pulse: 89  Temp: 98.1 F (36.7 C)  Resp: 18    No red flag s/s of low back pain. Patient was counseled on back pain precautions and told to do activity as tolerated but do not lift, push, or pull heavy objects more than 10 pounds for the next week.  Patient counseled to use ice or heat on back for no longer than 15 minutes every hour.   Patient prescribed muscle relaxer and counseled on proper use of muscle relaxant medication.    Patient prescribed narcotic pain medicine and counseled on proper use of narcotic pain medications. Counseled not to combine this medication with others containing tylenol.   Urged patient not to drink alcohol, drive, or perform any other activities that requires focus while taking either of these medications.  Patient urged to follow-up with PCP if pain does not improve with treatment and rest or if pain becomes recurrent. Urged to return with worsening severe pain, loss of bowel or bladder control, trouble walking.   The patient verbalizes understanding and agrees with the plan.   MDM   Final  diagnoses:  Bilateral low back pain with left-sided sciatica   Patient with back pain with lumbar radiculopathy. No neurological deficits. Patient is ambulatory. No warning symptoms of back pain including: fecal incontinence, urinary retention or overflow incontinence, night sweats, waking from sleep with back pain, unexplained fevers or weight loss, h/o cancer, IVDU, recent trauma. No concern for cauda equina, epidural abscess, or other serious cause of back pain. Conservative measures such as rest, ice/heat and pain medicine indicated with PCP follow-up if no improvement with conservative management.    I personally performed the services described in this documentation, which was scribed in my presence. The recorded information has been reviewed and is accurate.    Ivin Booty  Emmit Alexanders, PA-C 10/12/13 1846

## 2013-10-12 NOTE — ED Notes (Signed)
Pt states 3 days ago she bent over causing a sharp pain in her lower back. States she has had back problems before. Alert and oriented.

## 2013-10-12 NOTE — Discharge Instructions (Signed)
Please read and follow all provided instructions.  Your diagnoses today include:  1. Bilateral low back pain with left-sided sciatica     Tests performed today include:  Vital signs - see below for your results today  Medications prescribed:   Oxycodone - narcotic pain medication  DO NOT drive or perform any activities that require you to be awake and alert because this medicine can make you drowsy.    Robaxin (methocarbamol) - muscle relaxer medication  DO NOT drive or perform any activities that require you to be awake and alert because this medicine can make you drowsy.    Naproxen - anti-inflammatory pain medication  Do not exceed  naproxen every 12 hours, take with food  You have been prescribed an anti-inflammatory medication or NSAID. Take with food. Take smallest effective dose for the shortest duration needed for your pain. Stop taking if you experience stomach pain or vomiting.   Take any prescribed medications only as directed.  Home care instructions:   Follow any educational materials contained in this packet  Please rest, use ice or heat on your back for the next several days  Do not lift, push, pull anything more than 10 pounds for the next week  Follow-up instructions: Please follow-up with your primary care provider in the next 1 week for further evaluation of your symptoms.   Return instructions:  SEEK IMMEDIATE MEDICAL ATTENTION IF YOU HAVE:  New numbness, tingling, weakness, or problem with the use of your arms or legs  Severe back pain not relieved with medications  Loss control of your bowels or bladder  Increasing pain in any areas of the body (such as chest or abdominal pain)  Shortness of breath, dizziness, or fainting.   Worsening nausea (feeling sick to your stomach), vomiting, fever, or sweats  Any other emergent concerns regarding your health   Additional Information:  Your vital signs today were: BP 109/78   Pulse 89    Temp(Src) 98.1 F (36.7 C) (Oral)   Resp 18   SpO2 97%   LMP 10/09/2013 If your blood pressure (BP) was elevated above 135/85 this visit, please have this repeated by your doctor within one month. --------------

## 2013-10-12 NOTE — ED Provider Notes (Signed)
Medical screening examination/treatment/procedure(s) were performed by non-physician practitioner and as supervising physician I was immediately available for consultation/collaboration.   EKG Interpretation None      Devoria Albe, MD, Armando Gang   Ward Givens, MD 10/12/13 [phone]

## 2014-08-21 ENCOUNTER — Emergency Department (HOSPITAL_COMMUNITY): Payer: Medicaid Other

## 2014-08-21 ENCOUNTER — Emergency Department (HOSPITAL_COMMUNITY)
Admission: EM | Admit: 2014-08-21 | Discharge: 2014-08-21 | Disposition: A | Discharge status: Home / Self Care | Payer: Medicaid Other | Attending: Emergency Medicine | Admitting: Emergency Medicine

## 2014-08-21 ENCOUNTER — Encounter (HOSPITAL_COMMUNITY): Payer: Self-pay | Admitting: *Deleted

## 2014-08-21 DIAGNOSIS — Y92410 Unspecified street and highway as the place of occurrence of the external cause: Secondary | ICD-10-CM | POA: Insufficient documentation

## 2014-08-21 DIAGNOSIS — R110 Nausea: Secondary | ICD-10-CM | POA: Insufficient documentation

## 2014-08-21 DIAGNOSIS — Y999 Unspecified external cause status: Secondary | ICD-10-CM | POA: Insufficient documentation

## 2014-08-21 DIAGNOSIS — Z720 Tobacco use: Secondary | ICD-10-CM | POA: Insufficient documentation

## 2014-08-21 DIAGNOSIS — Y9389 Activity, other specified: Secondary | ICD-10-CM | POA: Insufficient documentation

## 2014-08-21 DIAGNOSIS — S3992XA Unspecified injury of lower back, initial encounter: Secondary | ICD-10-CM | POA: Diagnosis not present

## 2014-08-21 DIAGNOSIS — S0990XA Unspecified injury of head, initial encounter: Secondary | ICD-10-CM | POA: Insufficient documentation

## 2014-08-21 DIAGNOSIS — Z8619 Personal history of other infectious and parasitic diseases: Secondary | ICD-10-CM | POA: Insufficient documentation

## 2014-08-21 LAB — CBC
HCT: 42.2 % (ref 36.0–46.0)
Hemoglobin: 14 g/dL (ref 12.0–15.0)
MCH: 29.7 pg (ref 26.0–34.0)
MCHC: 33.2 g/dL (ref 30.0–36.0)
MCV: 89.6 fL (ref 78.0–100.0)
PLATELETS: 231 10*3/uL (ref 150–400)
RBC: 4.71 MIL/uL (ref 3.87–5.11)
RDW: 13.2 % (ref 11.5–15.5)
WBC: 6 10*3/uL (ref 4.0–10.5)

## 2014-08-21 LAB — BASIC METABOLIC PANEL
Anion gap: 9 (ref 5–15)
BUN: 13 mg/dL (ref 6–20)
CO2: 29 mmol/L (ref 22–32)
Calcium: 9.6 mg/dL (ref 8.9–10.3)
Chloride: 102 mmol/L (ref 101–111)
Creatinine, Ser: 0.79 mg/dL (ref 0.44–1.00)
GFR calc Af Amer: >60 mL/min (ref >60)
Glucose, Bld: 85 mg/dL (ref 65–99)
Potassium: 3.9 mmol/L (ref 3.5–5.1)
Sodium: 140 mmol/L (ref 135–145)

## 2014-08-21 MED ORDER — IOHEXOL 300 MG/ML  SOLN
100.0000 mL | Freq: Once | INTRAMUSCULAR | Status: AC | PRN
Start: 2014-08-21 — End: 2014-08-21
  Administered 2014-08-21: 100 mL via INTRAVENOUS

## 2014-08-21 MED ORDER — SODIUM CHLORIDE 0.9 % IV BOLUS (SEPSIS)
1000.0000 mL | Freq: Once | INTRAVENOUS | Status: AC
  Administered 2014-08-21: 1000 mL via INTRAVENOUS

## 2014-08-21 MED ORDER — HYDROCODONE-ACETAMINOPHEN 5-325 MG PO TABS: 1.0000 | ORAL_TABLET | Freq: Four times a day (QID) | ORAL | Status: DC | PRN

## 2014-08-21 MED ORDER — HYDROMORPHONE HCL 1 MG/ML IJ SOLN
1.0000 mg | Freq: Once | INTRAMUSCULAR | Status: AC
  Administered 2014-08-21: 1 mg via INTRAVENOUS
  Filled 2014-08-21: qty 1

## 2014-08-21 MED ORDER — HYDROMORPHONE HCL 1 MG/ML IJ SOLN
1.0000 mg | Freq: Once | INTRAMUSCULAR | Status: AC
Start: 2014-08-21 — End: 2014-08-21
  Administered 2014-08-21: 1 mg via INTRAVENOUS
  Filled 2014-08-21: qty 1

## 2014-08-21 NOTE — ED Notes (Signed)
Awake. Verbally responsive. A/O x4. Resp even and unlabored. No audible adventitious breath sounds noted. ABC's intact. IV patent and intact infusing NS at 92099ml/hr.

## 2014-08-21 NOTE — ED Notes (Signed)
Awake. Verbally responsive. A/O x4. Resp even and unlabored. No audible adventitious breath sounds noted. ABC's intact. Pt reported having generalized discomfort with lights off in room. IV infusing NS at 91699ml/hr without difficulty.

## 2014-08-21 NOTE — ED Notes (Signed)
RN Drawing Blood

## 2014-08-21 NOTE — Discharge Instructions (Signed)
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
## 2014-10-07 ENCOUNTER — Ambulatory Visit: Payer: Self-pay | Admitting: Obstetrics and Gynecology

## 2016-03-15 ENCOUNTER — Other Ambulatory Visit: Payer: Self-pay | Admitting: Internal Medicine

## 2016-03-15 ENCOUNTER — Encounter: Payer: Self-pay | Admitting: Internal Medicine

## 2016-03-15 ENCOUNTER — Other Ambulatory Visit (HOSPITAL_COMMUNITY)
Admission: RE | Admit: 2016-03-15 | Discharge: 2016-03-15 | Disposition: A | Discharge status: Home / Self Care | Payer: Medicaid Other | Source: Ambulatory Visit | Attending: Internal Medicine | Admitting: Internal Medicine

## 2016-03-15 ENCOUNTER — Ambulatory Visit (INDEPENDENT_AMBULATORY_CARE_PROVIDER_SITE_OTHER): Payer: Medicaid Other | Admitting: Internal Medicine

## 2016-03-15 VITALS — BP 133/72 | HR 58 | Temp 97.9°F | Ht 61.0 in | Wt 126.9 lb

## 2016-03-15 DIAGNOSIS — F32A Depression, unspecified: Secondary | ICD-10-CM

## 2016-03-15 DIAGNOSIS — F17210 Nicotine dependence, cigarettes, uncomplicated: Secondary | ICD-10-CM

## 2016-03-15 DIAGNOSIS — N939 Abnormal uterine and vaginal bleeding, unspecified: Secondary | ICD-10-CM | POA: Insufficient documentation

## 2016-03-15 DIAGNOSIS — F419 Anxiety disorder, unspecified: Secondary | ICD-10-CM

## 2016-03-15 DIAGNOSIS — F418 Other specified anxiety disorders: Secondary | ICD-10-CM | POA: Diagnosis not present

## 2016-03-15 DIAGNOSIS — N938 Other specified abnormal uterine and vaginal bleeding: Secondary | ICD-10-CM | POA: Diagnosis not present

## 2016-03-15 DIAGNOSIS — Z8041 Family history of malignant neoplasm of ovary: Secondary | ICD-10-CM

## 2016-03-15 DIAGNOSIS — F329 Major depressive disorder, single episode, unspecified: Secondary | ICD-10-CM

## 2016-03-15 DIAGNOSIS — Z113 Encounter for screening for infections with a predominantly sexual mode of transmission: Secondary | ICD-10-CM | POA: Insufficient documentation

## 2016-03-15 LAB — POCT URINE PREGNANCY: Preg Test, Ur: NEGATIVE

## 2016-03-15 MED ORDER — BUPROPION HCL 100 MG PO TABS: 100.0000 mg | ORAL_TABLET | Freq: Three times a day (TID) | ORAL | 1 refills | Status: DC

## 2016-03-15 MED ORDER — CLONAZEPAM 0.5 MG PO TABS: 0.5000 mg | ORAL_TABLET | ORAL | 0 refills | Status: DC | PRN

## 2016-03-15 NOTE — Patient Instructions (Addendum)
[name],  It was a pleasure meeting you today. Please start taking Wellbutrin 100 mg 2 times a day for the first 3.  Then start taking Wellbutrin 3 times a day for the next month.  Please take Klonopin as needed for severe anxiety I would like to see you back in one month [facility]

## 2016-03-15 NOTE — Progress Notes (Signed)
CC: vaginal bleeding  HPI:  [name] is a 46 y.o. with history noted below that presents to the internal medicine clinic for assessment of her anxiety and depression as well as persistent vaginal bleeding.  Patient states that shes had anxiety and depression for several years and has tried Zoloft and Prozac in the past with no benefit. She states that Wellbutrin and Klonopin has helped. She reports unable  to leave the house due to her anxiety.  She states that she worries that people are talking about her when she is out in public.  She used to see a psychiatrist but states she had has not seen a behavioral health provider in over 4 years.   Patient states that she's had vaginal bleeding for the past 6 months and the bleeding is intermittent and requires 3 maxipads a day for approximately 1 week. The bleeding occurs between menstrual cycles.  Her last menstrual cycle was on January 15. She denies any burning or dyspareunia.  She does not notice anything that helps or makes the bleeding worse. She does report vaginal itching and foul odor for the past week. She reports being currently sexually active with 1 female partner.    Past Medical History:  Diagnosis Date  . History of hepatitis C   . Uterine bleeding    Past Surgical History:  Procedure Laterality Date  . BREAST ENHANCEMENT SURGERY    . CESAREAN SECTION    . MYRINGOTOMY     Social History   Social History  . Marital status: Single    Spouse name: N/A  . Number of children: N/A  . Years of education: N/A   Occupational History  . Not on file.   Social History Main Topics  . Smoking status: Current Every Day Smoker    Packs/day: 1.00    Years: 30.00    Types: Cigarettes  . Smokeless tobacco: Not on file  . Alcohol use No     Comment: occ  . Drug use: Yes    Types: Marijuana     Comment: occ  . Sexual activity: Yes    Partners: Male    Birth control/ protection: None   Other Topics Concern  . Not on file    Social History Narrative  . No narrative on file    Review of Systems:  Review of Systems  Constitutional: Negative for chills and fever.  Gastrointestinal: Negative for abdominal pain, nausea and vomiting.  Genitourinary: Negative for dysuria, frequency and urgency.  Musculoskeletal: Negative for myalgias.  Skin: Positive for itching. Negative for rash.  Neurological: Positive for headaches.  Psychiatric/Behavioral: Positive for depression. Negative for substance abuse and suicidal ideas.  All other systems reviewed and are negative.    Physical Exam:  Vitals:   03/15/16 1352  BP: 133/72  Pulse: (!) 58  Temp: 97.9 F (36.6 C)  TempSrc: Oral  SpO2: 100%  Weight: 126 lb 14.4 oz (57.6 kg)  Height: 5\' 1"  (1.549 m)   Physical Exam  Constitutional: She is well-developed, well-nourished, and in no distress.  HENT:  Right Ear: Tympanic membrane and external ear normal.  Left Ear: External ear normal.  Left ear tube  Cardiovascular: Normal rate, regular rhythm and normal heart sounds.  Exam reveals no gallop and no friction rub.   No murmur heard. Pulmonary/Chest: Effort normal and breath sounds normal. No respiratory distress. She has no wheezes. She has no rales.  Abdominal: Soft. She exhibits no distension. There is no tenderness.  Genitourinary: No vaginal discharge found.  Genitourinary Comments: Bleeding in the vaginal vault  Musculoskeletal: She exhibits no edema.  Skin: Skin is warm and dry.    Assessment & Plan:   See encounters tab for problem based medical decision making.   Patient seen with Dr. Criselda PeachesMullen

## 2016-03-16 LAB — CERVICOVAGINAL ANCILLARY ONLY
CHLAMYDIA, DNA PROBE: NEGATIVE
Neisseria Gonorrhea: NEGATIVE
Wet Prep (BD Affirm): POSITIVE — AB

## 2016-03-18 ENCOUNTER — Encounter: Payer: Self-pay | Admitting: Internal Medicine

## 2016-03-18 DIAGNOSIS — F419 Anxiety disorder, unspecified: Secondary | ICD-10-CM

## 2016-03-18 DIAGNOSIS — Z8619 Personal history of other infectious and parasitic diseases: Secondary | ICD-10-CM | POA: Insufficient documentation

## 2016-03-18 DIAGNOSIS — F329 Major depressive disorder, single episode, unspecified: Secondary | ICD-10-CM | POA: Insufficient documentation

## 2016-03-18 DIAGNOSIS — F32A Depression, unspecified: Secondary | ICD-10-CM | POA: Insufficient documentation

## 2016-03-18 DIAGNOSIS — N939 Abnormal uterine and vaginal bleeding, unspecified: Secondary | ICD-10-CM | POA: Insufficient documentation

## 2016-03-18 MED ORDER — METRONIDAZOLE 500 MG PO TABS: 500.0000 mg | ORAL_TABLET | Freq: Two times a day (BID) | ORAL | 0 refills | Status: DC

## 2016-03-18 NOTE — Assessment & Plan Note (Addendum)
Assessment: Vaginal bleeding  Plan: In office pap smear, wet mount, pregnancy test.  Addendum:  Patient is positive for trichomonas and gardnerella.  Metronidazole 500mg  BID for 7 days was sent to the patient's pharmacy  Pregnancy test was negative Negative for chlamydia and gonorrhea

## 2016-03-18 NOTE — Assessment & Plan Note (Signed)
Assessment: anxiety and depression  Plan  Start Wellbutrin 100mg  BID for 3 days then titrate up to 100mg  TID for the next 3.5 weeks.  Klonopin as needed for severe anxiety #15

## 2016-03-19 ENCOUNTER — Ambulatory Visit (INDEPENDENT_AMBULATORY_CARE_PROVIDER_SITE_OTHER): Payer: Medicaid Other | Admitting: Internal Medicine

## 2016-03-19 ENCOUNTER — Other Ambulatory Visit (HOSPITAL_COMMUNITY)
Admission: RE | Admit: 2016-03-19 | Discharge: 2016-03-19 | Disposition: A | Discharge status: Home / Self Care | Payer: Medicaid Other | Source: Ambulatory Visit | Attending: Internal Medicine | Admitting: Internal Medicine

## 2016-03-19 VITALS — BP 109/71 | HR 66 | Temp 97.8°F | Ht 61.0 in | Wt 126.7 lb

## 2016-03-19 DIAGNOSIS — N760 Acute vaginitis: Secondary | ICD-10-CM

## 2016-03-19 DIAGNOSIS — A64 Unspecified sexually transmitted disease: Secondary | ICD-10-CM | POA: Insufficient documentation

## 2016-03-19 DIAGNOSIS — Z8742 Personal history of other diseases of the female genital tract: Secondary | ICD-10-CM | POA: Diagnosis not present

## 2016-03-19 DIAGNOSIS — N939 Abnormal uterine and vaginal bleeding, unspecified: Secondary | ICD-10-CM | POA: Diagnosis not present

## 2016-03-19 DIAGNOSIS — R768 Other specified abnormal immunological findings in serum: Secondary | ICD-10-CM

## 2016-03-19 DIAGNOSIS — Z8619 Personal history of other infectious and parasitic diseases: Secondary | ICD-10-CM | POA: Diagnosis not present

## 2016-03-19 DIAGNOSIS — Z114 Encounter for screening for human immunodeficiency virus [HIV]: Secondary | ICD-10-CM

## 2016-03-19 DIAGNOSIS — G43709 Chronic migraine without aura, not intractable, without status migrainosus: Secondary | ICD-10-CM | POA: Diagnosis not present

## 2016-03-19 DIAGNOSIS — Z598 Other problems related to housing and economic circumstances: Secondary | ICD-10-CM | POA: Diagnosis not present

## 2016-03-19 DIAGNOSIS — B9689 Other specified bacterial agents as the cause of diseases classified elsewhere: Secondary | ICD-10-CM

## 2016-03-19 DIAGNOSIS — IMO0002 Reserved for concepts with insufficient information to code with codable children: Secondary | ICD-10-CM | POA: Insufficient documentation

## 2016-03-19 DIAGNOSIS — A5901 Trichomonal vulvovaginitis: Secondary | ICD-10-CM

## 2016-03-19 LAB — GLUCOSE, CAPILLARY: Glucose-Capillary: 73 mg/dL (ref 65–99)

## 2016-03-19 LAB — CYTOLOGY - PAP
Diagnosis: NEGATIVE
HPV (WINDOPATH): NOT DETECTED

## 2016-03-19 LAB — POCT GLYCOSYLATED HEMOGLOBIN (HGB A1C): HEMOGLOBIN A1C: 5.5

## 2016-03-19 MED ORDER — METRONIDAZOLE 500 MG PO TABS: 2000.0000 mg | ORAL_TABLET | Freq: Once | ORAL | 0 refills | Status: DC

## 2016-03-19 MED ORDER — SUMATRIPTAN SUCCINATE 25 MG PO TABS: 25.0000 mg | ORAL_TABLET | ORAL | 0 refills | Status: DC | PRN

## 2016-03-19 MED ORDER — FLAGYL 500 MG PO TABS: 2000.0000 mg | ORAL_TABLET | Freq: Once | ORAL | 0 refills | Status: AC

## 2016-03-19 MED ORDER — METRONIDAZOLE 500 MG PO TABS: 500.0000 mg | ORAL_TABLET | Freq: Two times a day (BID) | ORAL | 0 refills | Status: DC

## 2016-03-19 MED ORDER — METRONIDAZOLE 500 MG PO TABS: 2000.0000 mg | ORAL_TABLET | Freq: Once | ORAL | Status: DC

## 2016-03-19 MED ORDER — FLAGYL 500 MG PO TABS: 2000.0000 mg | ORAL_TABLET | Freq: Once | ORAL | 0 refills | Status: DC

## 2016-03-19 MED FILL — metroNIDAZOLE 500 MG TABS: 500 | 1 days supply | Qty: 4 | Fill #0

## 2016-03-19 MED FILL — metroNIDAZOLE 500 MG TABS: 500 | 7 days supply | Qty: 14 | Fill #0

## 2016-03-19 MED FILL — SUMATRIPTAN SUCC 25 MG TAB: 25 | 30 days supply | Qty: 10 | Fill #0

## 2016-03-19 NOTE — Assessment & Plan Note (Addendum)
Will test for Hepatitis C with Hep C ab given history of infection.  Addendum: Hepatitis C antibody positive. I called the patient to discuss results, but there is no answer. She was instructed to call the clinic back. She has a follow-up appointment with me in the next week. At that time we will check an RNA level, type, and referr to infectious disease.

## 2016-03-19 NOTE — Progress Notes (Signed)
    CC: Vaginal Bleeding  HPI: [name] is a 46 y.o. female with PMHx of H/o Hepatitis C, Depression, Anxiety who presents to the clinic for follow up for vaginal bleeding.  Patient was recently seen for abnormal uterine bleeding 4 days ago. Patient states for the past 5-6 months she has experienced anovulatory bleeding requiring her to use 2 pads per day. She continues to get her regular monthly menstrual cycle. She is sexually active. She does admit to vaginal discharge with odor. She has a history of abnormal paps and has also had a D&C in the past. She is not on birth control. Last visit, testing was significant for a positive trichomonas, positive BV, negative pregnancy test. Pap smear was normal. No G/C. Patient requests retesting for trich as she is unsure this is true. She has not picked up Flagyl due to financial reasons.   Patient also admits to chronic headaches, which occur several times per week, last for 3-4 hours, feels like sharp throbbing pain behind her eyes and are associated with phonophobia, photophobia, nausea and occasional vomiting. She does not take anything for her symptoms. She avoids tylenol due to h/o hep C. She states NSAIDs do not work for her.    Past Medical History:  Diagnosis Date  . Anxiety and depression   . History of hepatitis C     Review of Systems: Please see pertinent ROS reviewed in HPI and problem based charting.    Physical Exam: Vitals:   03/19/16 1354  BP: 109/71  Pulse: 66  Temp: 97.8 F (36.6 C)  TempSrc: Oral  SpO2: 100%  Weight: 126 lb 11.2 oz (57.5 kg)  Height: 5\' 1"  (1.549 m)   General: Vital signs reviewed.  Patient is well-developed and well-nourished, in no acute distress and cooperative with exam.  Cardiovascular: RRR, S1 normal, S2 normal, no murmurs, gallops, or rubs. Pulmonary/Chest: Clear to auscultation bilaterally, no wheezes, rales, or rhonchi. Abdominal: Soft, non-tender, non-distended, BS +, no masses,  organomegaly, or guarding present.  Pelvic exam: VULVA: normal appearing vulva with no masses, tenderness or lesions VAGINA: normal appearing vagina with normal color, no lesions, vaginal discharge - creamy, bloody and malodorous, CERVIX: normal, WET MOUNT done - results: pending, exam chaperoned by Lela Sturdivant. Extremities: No lower extremity edema bilaterally Skin: Warm, dry and intact.  Psychiatric: Normal mood and affect. speech and behavior is normal. Cognition and memory are normal.   Assessment & Plan:  See encounters tab for problem based medical decision making. Patient discussed with Dr. Heide SparkNarendra

## 2016-03-19 NOTE — Assessment & Plan Note (Addendum)
Patient was recently seen for abnormal uterine bleeding 4 days ago. Patient states for the past 5-6 months she has Patient does admit to vaginal discharge with odor. Last visit, testing was significant for a positive trichomonas, positive BV, negative pregnancy test.  She has not picked up Flagyl due to financial reasons.   Plan: -Sent in Flagyl for patient and her partner through charity program -Test for HIV -Hepatitis C ab  Addendum: Hepatitis C antibody positive, HIV negative, repeat trichomonas positive.

## 2016-03-19 NOTE — Assessment & Plan Note (Signed)
Patient also admits to chronic headaches, which occur several times per week, last for 3-4 hours, feels like sharp throbbing pain behind her eyes and are associated with phonophobia, photophobia, nausea and occasional vomiting. She does not take anything for her symptoms. She avoids tylenol due to h/o hep C. She states NSAIDs do not work for her.    Assessment: Chronic Migraine Headaches  Plan: -Sumatriptan 25 mg prn

## 2016-03-19 NOTE — Patient Instructions (Addendum)
For your migraine headaches, take sumatriptan at first onset of headache.  We have tested you for Trichomonas again today. We have treated you with Flagyl. Please pick up Flagyl from your pharmacy to treat your bacterial vaginosis. Your partner should also be treated for Trichomonas (4 pills of 500 mg). We have sent this into your pharmacy for them.  We are testing your blood work today for HIV, Hepatitis C, liver, kidney and diabetes.   Follow up in 3 months with your primary care doctor.

## 2016-03-19 NOTE — Assessment & Plan Note (Addendum)
Patient was recently seen for abnormal anovulatory uterine bleeding 4 days ago. Patient states for the past 5-6 months she has experienced anovulatory bleeding requiring her to use 2 pads per day. She continues to get her regular monthly menstrual cycles. She is sexually active. She does admit to vaginal discharge with odor. She has a history of abnormal paps and has also had a D&C in the past. She is not on birth control. Last visit, testing was significant for a positive trichomonas, positive BV, negative pregnancy test. Pap smear was normal. No G/C. Patient requests retesting for trich as she is unsure this is true. She has not picked up Flagyl due to financial reasons. AUB may be due to STD, perimenopause or uterine abnormality.   Plan: -Treat trichomonas -Referral to Ob/Gyn for possible transvaginal US and possible endometrial biopsy

## 2016-03-20 ENCOUNTER — Encounter: Payer: Self-pay | Admitting: Internal Medicine

## 2016-03-20 LAB — CMP14 + ANION GAP
A/G RATIO: 1.7 (ref 1.2–2.2)
ALT: 19 IU/L (ref 0–32)
AST: 17 IU/L (ref 0–40)
Albumin: 4.6 g/dL (ref 3.5–5.5)
Alkaline Phosphatase: 66 IU/L (ref 39–117)
Anion Gap: 16 mmol/L (ref 10.0–18.0)
BUN/Creatinine Ratio: 17 (ref 9–23)
BUN: 11 mg/dL (ref 6–24)
Bilirubin Total: 0.3 mg/dL (ref 0.0–1.2)
CO2: 23 mmol/L (ref 18–29)
Calcium: 9.4 mg/dL (ref 8.7–10.2)
Chloride: 101 mmol/L (ref 96–106)
Creatinine, Ser: 0.63 mg/dL (ref 0.57–1.00)
GFR calc Af Amer: 125 mL/min/{1.73_m2} (ref >59)
GFR, EST NON AFRICAN AMERICAN: 109 mL/min/{1.73_m2} (ref >59)
GLOBULIN, TOTAL: 2.7 g/dL (ref 1.5–4.5)
Glucose: 80 mg/dL (ref 65–99)
POTASSIUM: 4.3 mmol/L (ref 3.5–5.2)
Sodium: 140 mmol/L (ref 134–144)
Total Protein: 7.3 g/dL (ref 6.0–8.5)

## 2016-03-20 LAB — CBC
Hematocrit: 43.7 % (ref 34.0–46.6)
Hemoglobin: 14.8 g/dL (ref 11.1–15.9)
MCH: 30.1 pg (ref 26.6–33.0)
MCHC: 33.9 g/dL (ref 31.5–35.7)
MCV: 89 fL (ref 79–97)
PLATELETS: 239 10*3/uL (ref 150–379)
RBC: 4.92 x10E6/uL (ref 3.77–5.28)
RDW: 13.5 % (ref 12.3–15.4)
WBC: 6.6 10*3/uL (ref 3.4–10.8)

## 2016-03-20 LAB — CERVICOVAGINAL ANCILLARY ONLY: WET PREP (BD AFFIRM): POSITIVE — AB

## 2016-03-20 LAB — HIV ANTIBODY (ROUTINE TESTING W REFLEX): HIV Screen 4th Generation wRfx: NONREACTIVE

## 2016-03-20 LAB — HEPATITIS C ANTIBODY: Hep C Virus Ab: >11 s/co ratio — ABNORMAL HIGH (ref 0.0–0.9)

## 2016-03-20 NOTE — Progress Notes (Signed)
Internal Medicine Clinic Attending  I saw and evaluated the patient.  I personally confirmed the key portions of the history and exam documented by Dr. Mikey BussingHoffman and I reviewed pertinent patient test results.  The assessment, diagnosis, and plan were formulated together and I agree with the documentation in the resident's note.  I was present for procedure - PAP smear/pelvic exam.  Also note patient has a family history of ovarian cancer in an Aunt.

## 2016-03-21 ENCOUNTER — Telehealth: Payer: Self-pay | Admitting: *Deleted

## 2016-03-21 NOTE — Telephone Encounter (Signed)
-----   Message from Camelia PhenesJessica Ratliff Hoffman, [place] sent at 03/19/2016 11:10 AM EST ----- Lorenza BurtonHi Helen,  I have attempted to call Ms. Shifflett several times but did not get an answer.  I left a message to call the clinic to discuss results done on Thursday and that I sent a new prescription to the pharmacy.  If she calls back can you please let her know that she is  negative for Ghonorrea or chlamydia.   She is positive for trichomonas and gardnerella.  She needs to take metronidazole 500mg  BID for 7 days (at pharmacy).  And that trichomonas is a sexually transmitted infection and that any partner needs to be treated as well.    Thank you, Geralyn CorwinJessica Hoffman

## 2016-03-21 NOTE — Progress Notes (Signed)
Internal Medicine Clinic Attending  Case discussed with Dr. Burns at the time of the visit.  We reviewed the resident's history and exam and pertinent patient test results.  I agree with the assessment, diagnosis, and plan of care documented in the resident's note.  

## 2016-03-21 NOTE — Telephone Encounter (Signed)
Thank you :)

## 2016-03-21 NOTE — Telephone Encounter (Signed)
Tried to call pt yesterday no answer, called today and awoke pt, gave her all info and to please encourage partner to be treated, she ask if there was enough for both and I said no, that the partner would need to seek medical care on their own, she stated thank you and hung up

## 2016-03-22 ENCOUNTER — Telehealth: Payer: Self-pay

## 2016-03-22 NOTE — Telephone Encounter (Signed)
Transferred call to dr burns

## 2016-03-22 NOTE — Telephone Encounter (Signed)
Questions about lab result. Please call pt back.

## 2016-03-25 NOTE — Progress Notes (Signed)
Patient was notified of results and instructed to take metronidazole 500mg  BID for 7 days.  She was told that any sexual partners needed to be treated as well

## 2016-03-27 ENCOUNTER — Other Ambulatory Visit: Payer: Self-pay | Admitting: *Deleted

## 2016-03-27 ENCOUNTER — Other Ambulatory Visit: Payer: Self-pay | Admitting: Internal Medicine

## 2016-03-27 ENCOUNTER — Ambulatory Visit (INDEPENDENT_AMBULATORY_CARE_PROVIDER_SITE_OTHER): Payer: Medicaid Other | Admitting: Internal Medicine

## 2016-03-27 VITALS — BP 134/75 | HR 54 | Temp 97.7°F | Ht 61.0 in | Wt 128.0 lb

## 2016-03-27 DIAGNOSIS — A64 Unspecified sexually transmitted disease: Secondary | ICD-10-CM

## 2016-03-27 DIAGNOSIS — Z1231 Encounter for screening mammogram for malignant neoplasm of breast: Secondary | ICD-10-CM

## 2016-03-27 DIAGNOSIS — R8299 Other abnormal findings in urine: Secondary | ICD-10-CM

## 2016-03-27 DIAGNOSIS — N939 Abnormal uterine and vaginal bleeding, unspecified: Secondary | ICD-10-CM

## 2016-03-27 DIAGNOSIS — F17210 Nicotine dependence, cigarettes, uncomplicated: Secondary | ICD-10-CM | POA: Diagnosis not present

## 2016-03-27 DIAGNOSIS — F419 Anxiety disorder, unspecified: Secondary | ICD-10-CM

## 2016-03-27 DIAGNOSIS — R768 Other specified abnormal immunological findings in serum: Secondary | ICD-10-CM | POA: Diagnosis not present

## 2016-03-27 DIAGNOSIS — F32A Depression, unspecified: Secondary | ICD-10-CM

## 2016-03-27 DIAGNOSIS — Z8742 Personal history of other diseases of the female genital tract: Secondary | ICD-10-CM | POA: Diagnosis not present

## 2016-03-27 DIAGNOSIS — F17200 Nicotine dependence, unspecified, uncomplicated: Secondary | ICD-10-CM | POA: Insufficient documentation

## 2016-03-27 DIAGNOSIS — F329 Major depressive disorder, single episode, unspecified: Secondary | ICD-10-CM

## 2016-03-27 DIAGNOSIS — Z8619 Personal history of other infectious and parasitic diseases: Secondary | ICD-10-CM | POA: Diagnosis not present

## 2016-03-27 DIAGNOSIS — R82998 Other abnormal findings in urine: Secondary | ICD-10-CM

## 2016-03-27 DIAGNOSIS — F418 Other specified anxiety disorders: Secondary | ICD-10-CM

## 2016-03-27 DIAGNOSIS — Z09 Encounter for follow-up examination after completed treatment for conditions other than malignant neoplasm: Secondary | ICD-10-CM

## 2016-03-27 DIAGNOSIS — Z9882 Breast implant status: Secondary | ICD-10-CM

## 2016-03-27 DIAGNOSIS — Z720 Tobacco use: Secondary | ICD-10-CM

## 2016-03-27 MED ORDER — VARENICLINE TARTRATE 0.5 MG X 11 & 1 MG X 42 PO MISC: ORAL | 0 refills | Status: DC

## 2016-03-27 MED ORDER — BUPROPION HCL 100 MG PO TABS: 100.0000 mg | ORAL_TABLET | Freq: Three times a day (TID) | ORAL | 11 refills | Status: DC

## 2016-03-27 MED ORDER — CLONAZEPAM 0.5 MG PO TABS: 0.5000 mg | ORAL_TABLET | ORAL | 0 refills | Status: DC | PRN

## 2016-03-27 MED FILL — CHANTIX STARTING MONTH BOX: 0.5 MG X 11 | 26 days supply | Qty: 53 | Fill #0

## 2016-03-27 MED FILL — clonazePAM 0.5 MG TABS: 0.5 | 15 days supply | Qty: 15 | Fill #0

## 2016-03-27 NOTE — Patient Instructions (Signed)
[name],  For your depression, continue taking BP per prealbumin 100 mg 3 times a day. For tobacco cessation take Chantix as prescribed. Please let us know if you develop any side effects from this medication including vivid dreams.  We will check lab work today. I will notify you of the results.  We are still waiting for the referral to the OB/GYN to get through. We will also test your urine today.

## 2016-03-27 NOTE — Assessment & Plan Note (Signed)
Patient has a history of anxiety and depression. She has taken Wellbutrin "forever" and Klonopin in the past. She states that she only takes Klonopin as needed before she goes out in public. When I check [place] controlled substance database, I do not see a spell of Klonopin since August 2017. However, if she paid cash for the Klonopin and may not have resulted. She is requesting a refill today. I will refill Klonopin 0.5 mg daily when necessary #15. Future and ongoing refills should be deferred to her primary care physician, Dr. Geralyn CorwinJessica Hoffman. I would recommend checking a urine drug screen and the database prior to starting her chronically on this medication. Hopefully, this will be a bridge while awaiting for her restarted Wellbutrin to be effective.

## 2016-03-27 NOTE — Assessment & Plan Note (Signed)
Patient would like to start breast cancer screening with yearly mammograms. A risk-benefit discussion was had as far as starting in her 40s versus waiting to her 50s. Patient would like to start now. No family history of breast cancer. She does have bilateral breast implants.  Plan: -Mammogram ordered

## 2016-03-27 NOTE — Addendum Note (Signed)
Addended by: Army FossaSTURDIVANT, Meryl Hubers W on: 03/27/2016 04:31 PM   Modules accepted: Orders

## 2016-03-27 NOTE — Addendum Note (Signed)
Addended by: Karlene LinemanBURNS, Soraya Paquette R on: 03/27/2016 04:58 PM   Modules accepted: Orders

## 2016-03-27 NOTE — Assessment & Plan Note (Addendum)
Patient states she has noticed dark yellow and orange urine. She admits to taking a supplement such as tumeric and fish oil. She denies any dysuria, increased frequency, urgency. Possibly secondary to the tumeric. UA is normal. Recommended stopping tumeric

## 2016-03-27 NOTE — Assessment & Plan Note (Addendum)
Patient has a history of hepatitis C, treated. Last visit, we rechecked a hepatitis C antibody which was positive. Today, we are checking a hepatitis C RNA level. If undetectable, patient has successfully cleared hepatitis C. If elevated, patient has likely had reinfection.  From records, we will need to confirm Hepatitis B immunization.

## 2016-03-27 NOTE — Assessment & Plan Note (Signed)
Patient reports that after treatment of Trichomonas with Flagyl per vaginal bleeding has resolved. Her referral to OB/GYN is still pending.

## 2016-03-27 NOTE — Assessment & Plan Note (Signed)
Patient desires to quit smoking. Although she has been on Wellbutrin for many years, she states that this does not help her quit smoking. She says she has been successively treated for tobacco abuse in the past with Wellbutrin and Chantix concomitantly. She requests a refill of Chantix  Plan: -Continue Wellbutrin 100 mg 3 times a day -Starts starter pack of Chantix. -Educated on possible side effects of Chantix

## 2016-03-27 NOTE — Progress Notes (Signed)
    CC: Follow-up for abnormal uterine bleeding  HPI: [name] is a 46 y.o. female with PMHx of chronic headache, history of hepatitis C, abnormal uterine bleeding who presents to the clinic for follow-up for headaches and abnormal uterine bleeding.   Please see problem based assessment and plan for more information. Patient denies chest pain or shortness of breath. She is eating and drinking well.  Past Medical History:  Diagnosis Date  . Anxiety and depression   . History of hepatitis C     Review of Systems: Please see pertinent ROS reviewed in HPI and problem based charting.   Physical Exam: Vitals:   03/27/16 1405  BP: 134/75  Pulse: (!) 54  Temp: 97.7 F (36.5 C)  TempSrc: Oral  SpO2: 100%  Weight: 128 lb (58.1 kg)  Height: 5\' 1"  (1.549 m)   General: Vital signs reviewed.  Patient is well-developed and well-nourished, in no acute distress and cooperative with exam.  Cardiovascular: RRR, S1 normal, S2 normal, no murmurs, gallops, or rubs. Pulmonary/Chest: Clear to auscultation bilaterally, no wheezes, rales, or rhonchi. Abdominal: Soft, non-tender, non-distended, BS + Extremities: No lower extremity edema bilaterally Skin: Warm, dry and intact. No rashes or erythema. Psychiatric: Slightly anxious. speech and behavior is normal. Cognition and memory are normal.   Assessment & Plan:  See encounters tab for problem based medical decision making. Patient discussed with Dr. Cleda DaubE. Hoffman

## 2016-03-27 NOTE — Telephone Encounter (Signed)
Clonazepam called in. Per pharmacist- Chantix costs >$400 for patient. Spoke to Dr. Lawerance BachBurns & agreed to consult you about alternative or helping pt get medicine @ a lower price. Pls. Advice!

## 2016-03-28 ENCOUNTER — Encounter: Payer: Self-pay | Admitting: Internal Medicine

## 2016-03-28 LAB — HCV RNA QUANT: HEPATITIS C QUANTITATION: NOT DETECTED [IU]/mL

## 2016-03-29 NOTE — Telephone Encounter (Signed)
Thank you :)

## 2016-04-02 NOTE — Progress Notes (Signed)
Internal Medicine Clinic Attending  Case discussed with Dr. Burns soon after the resident saw the patient.  We reviewed the resident's history and exam and pertinent patient test results.  I agree with the assessment, diagnosis, and plan of care documented in the resident's note. 

## 2016-04-09 ENCOUNTER — Other Ambulatory Visit: Payer: Self-pay | Admitting: Internal Medicine

## 2016-04-09 ENCOUNTER — Encounter: Payer: Self-pay | Admitting: Internal Medicine

## 2016-04-09 ENCOUNTER — Ambulatory Visit
Admission: RE | Admit: 2016-04-09 | Discharge: 2016-04-09 | Disposition: A | Discharge status: Home / Self Care | Payer: Medicaid Other | Source: Ambulatory Visit | Attending: Internal Medicine | Admitting: Internal Medicine

## 2016-04-09 DIAGNOSIS — Z1231 Encounter for screening mammogram for malignant neoplasm of breast: Secondary | ICD-10-CM

## 2016-04-20 ENCOUNTER — Other Ambulatory Visit (HOSPITAL_COMMUNITY)
Admission: RE | Admit: 2016-04-20 | Discharge: 2016-04-20 | Disposition: A | Discharge status: Home / Self Care | Payer: Medicaid Other | Source: Ambulatory Visit | Attending: Internal Medicine | Admitting: Internal Medicine

## 2016-04-20 ENCOUNTER — Ambulatory Visit (INDEPENDENT_AMBULATORY_CARE_PROVIDER_SITE_OTHER): Payer: Medicaid Other | Admitting: Internal Medicine

## 2016-04-20 VITALS — BP 125/75 | HR 66 | Temp 98.3°F | Wt 131.7 lb

## 2016-04-20 DIAGNOSIS — N898 Other specified noninflammatory disorders of vagina: Secondary | ICD-10-CM

## 2016-04-20 DIAGNOSIS — Z8742 Personal history of other diseases of the female genital tract: Secondary | ICD-10-CM | POA: Diagnosis not present

## 2016-04-20 DIAGNOSIS — A64 Unspecified sexually transmitted disease: Secondary | ICD-10-CM | POA: Insufficient documentation

## 2016-04-20 NOTE — Progress Notes (Signed)
   CC: vaginal foul smell  HPI:  [name] is a 46 y.o. with pmh as listed below is here with vaginal foul discharge.   Past Medical History:  Diagnosis Date  . Anxiety and depression   . History of hepatitis C    Seen on 2/1 for post menopausal vaginal bleeding. Pap, wetmount, and preg test done, pap was normal but she was +trichmonas and gardenella. Given 7 days of metronidazole but did not pick up due to financial difficulty.   Seen on 03/19/16 with malodorous vaginal discharge. That visit her Hep C antibody was positive but RNA negative, HIV was neg, and trichomonas and gardenella was positive. Flagyl was sent again with charity program for her and her partner. She came on 2/13 when she stated her symptoms improved with the treatment. However, today she is saying her symptoms never completely resolved but was slightly better, it used to just smell bad in the past but now also having yellow discharge. Makes her uncomfortable to have sex with partner. Since last visit, her partner took the metronidazole but they also had sex in the mean time (used condom). Afterwards, she separated from her partner due to getting STD from him per patient. No new partner.   Denies any vaginal pain, burning, dysuria, fever, abd pain, n/v, rash, or any other complaitns today   Review of Systems:   Review of Systems  Constitutional: Negative for chills and fever.  Gastrointestinal: Negative for abdominal pain, nausea and vomiting.  Genitourinary: Negative for dysuria, flank pain, frequency, hematuria and urgency.       Foul smelling discharge  Skin: Negative for rash.     Physical Exam:  Vitals:   04/20/16 1337  BP: 125/75  Pulse: 66  Temp: 98.3 F (36.8 C)  TempSrc: Oral  SpO2: 100%  Weight: 131 lb 11.2 oz (59.7 kg)   Physical Exam  Constitutional: She appears well-developed and well-nourished. No distress.  GI: Soft. Bowel sounds are normal. She exhibits no distension. There is no  tenderness.  Genitourinary: Vagina normal. There is no rash, tenderness, lesion or injury on the right labia. There is no rash, tenderness, lesion or injury on the left labia. No erythema, tenderness or bleeding in the vagina. No foreign body in the vagina. No signs of injury around the vagina. No vaginal discharge found.  Genitourinary Comments: Normal smell, no vaginal discharge or erythema noted. No cervical motion tenderness. No mass or lesions.   Skin: She is not diaphoretic.    Assessment & Plan:   See Encounters Tab for problem based charting.  Patient discussed with Dr. Rogelia BogaButcher

## 2016-04-20 NOTE — Assessment & Plan Note (Addendum)
Patient having recurrent vaginal discharge and foul smell. Pelvic exam is normal without any discharge seen currently. Was recently  Treated for BV and trich with 7 days of metronidazole but she is having recurrent symptoms. Her partner was also given metronidazole. They did have sex after the last visit few times using condom.  Could be recurrence of trich from the partner or recurrent BV.  -performed wet prep and gc/chlymadia test again today. Will treat based on the results.

## 2016-04-20 NOTE — Progress Notes (Signed)
Internal Medicine Clinic Attending  Case discussed with Dr. Ahmed at the time of the visit.  We reviewed the resident's history and exam and pertinent patient test results.  I agree with the assessment, diagnosis, and plan of care documented in the resident's note. 

## 2016-04-20 NOTE — Patient Instructions (Signed)
Will call with the results

## 2016-04-23 LAB — CERVICOVAGINAL ANCILLARY ONLY
CHLAMYDIA, DNA PROBE: NEGATIVE
NEISSERIA GONORRHEA: NEGATIVE
WET PREP (BD AFFIRM): POSITIVE — AB

## 2016-04-24 ENCOUNTER — Telehealth: Payer: Self-pay | Admitting: Internal Medicine

## 2016-04-24 NOTE — Telephone Encounter (Signed)
Asking for results from appt friday

## 2016-04-25 MED ORDER — METRONIDAZOLE 500 MG PO TABS: 500.0000 mg | ORAL_TABLET | Freq: Two times a day (BID) | ORAL | 0 refills | Status: AC

## 2016-04-25 MED FILL — metroNIDAZOLE 500 MG TABS: 500 | 7 days supply | Qty: 14 | Fill #0

## 2016-04-25 NOTE — Addendum Note (Signed)
Addended by: Hyacinth MeekerAHMED, Fransisco Messmer on: 04/25/2016 09:21 AM   Modules accepted: Orders

## 2016-04-25 NOTE — Telephone Encounter (Signed)
Pt calling again.

## 2016-04-26 NOTE — Telephone Encounter (Signed)
Request sent to Dr Tasia CatchingsAhmed who saw pt on Friday.

## 2016-04-26 NOTE — Telephone Encounter (Signed)
Pt wanted to know why Flagyl was prescribed; informed her d/t BV per Dr Tasia CatchingsAhmed. She has questions about possible yeast infection form "all the Flagyl I've been taking". Asked Dr Tasia CatchingsAhmed to speak to pt - call transferred.

## 2016-04-26 NOTE — Telephone Encounter (Signed)
I have called the patient yesterday and told her that I sent medication to her pharmacy for the bacterial vaginosis. I could not give her all the details obviously on voicemail. I called her again this morning but it went to VM.   Please tell her when she calls back that she has recurrent bacterial vaginosis. I sent metronidazole 500mg  bid for 7 days to the pharmacy.

## 2016-04-26 NOTE — Telephone Encounter (Signed)
Discussed with patient. She started taking the flagyl again yesterday, had one dose so far. No yeast was seen on last vaginal sample. We will do the flagyl for 1 week and if her symptoms are not improving we will re-examine and if she needs treatment for yeast vaginitis we will do it at that point.

## 2016-04-30 ENCOUNTER — Encounter: Payer: Medicaid Other | Admitting: Obstetrics and Gynecology

## 2016-05-03 ENCOUNTER — Other Ambulatory Visit: Payer: Self-pay | Admitting: Internal Medicine

## 2016-05-03 DIAGNOSIS — F329 Major depressive disorder, single episode, unspecified: Secondary | ICD-10-CM

## 2016-05-03 DIAGNOSIS — Z720 Tobacco use: Secondary | ICD-10-CM

## 2016-05-03 DIAGNOSIS — F419 Anxiety disorder, unspecified: Principal | ICD-10-CM

## 2016-05-03 NOTE — Telephone Encounter (Signed)
Refill Request  varenicline (CHANTIX PAK) 0.5 MG X 11 & 1 MG X 42 tablet  clonazePAM (KLONOPIN) 0.5 MG tablet

## 2016-05-04 MED ORDER — VARENICLINE TARTRATE 0.5 MG X 11 & 1 MG X 42 PO MISC: ORAL | 0 refills | Status: DC

## 2016-05-04 MED ORDER — CLONAZEPAM 0.5 MG PO TABS: 0.5000 mg | ORAL_TABLET | ORAL | 0 refills | Status: DC | PRN

## 2016-05-08 NOTE — Telephone Encounter (Signed)
Called to pharm 

## 2016-05-15 NOTE — Addendum Note (Signed)
Addended by: Bufford Spikes on: 05/15/2016 04:07 PM   Modules accepted: Orders

## 2016-05-21 ENCOUNTER — Encounter: Payer: Medicaid Other | Admitting: Obstetrics & Gynecology

## 2016-05-28 ENCOUNTER — Encounter (INDEPENDENT_AMBULATORY_CARE_PROVIDER_SITE_OTHER): Payer: Self-pay

## 2016-05-28 ENCOUNTER — Ambulatory Visit (INDEPENDENT_AMBULATORY_CARE_PROVIDER_SITE_OTHER): Payer: Medicaid Other | Admitting: Internal Medicine

## 2016-05-28 ENCOUNTER — Other Ambulatory Visit (HOSPITAL_COMMUNITY)
Admission: RE | Admit: 2016-05-28 | Discharge: 2016-05-28 | Disposition: A | Discharge status: Home / Self Care | Payer: Medicaid Other | Source: Ambulatory Visit | Attending: Internal Medicine | Admitting: Internal Medicine

## 2016-05-28 VITALS — BP 115/81 | HR 56 | Temp 97.6°F | Ht 62.0 in | Wt 133.4 lb

## 2016-05-28 DIAGNOSIS — A64 Unspecified sexually transmitted disease: Secondary | ICD-10-CM | POA: Diagnosis not present

## 2016-05-28 DIAGNOSIS — N760 Acute vaginitis: Secondary | ICD-10-CM | POA: Diagnosis not present

## 2016-05-28 DIAGNOSIS — A5602 Chlamydial vulvovaginitis: Secondary | ICD-10-CM

## 2016-05-28 DIAGNOSIS — B9689 Other specified bacterial agents as the cause of diseases classified elsewhere: Secondary | ICD-10-CM | POA: Diagnosis not present

## 2016-05-28 DIAGNOSIS — Z8619 Personal history of other infectious and parasitic diseases: Secondary | ICD-10-CM

## 2016-05-28 LAB — POCT URINALYSIS DIPSTICK
Bilirubin, UA: NEGATIVE
Glucose, UA: NEGATIVE
Ketones, UA: NEGATIVE
Leukocytes, UA: NEGATIVE
Nitrite, UA: NEGATIVE
PH UA: 6.5 (ref 5.0–8.0)
Protein, UA: NEGATIVE
RBC UA: NEGATIVE
Spec Grav, UA: 1.025 (ref 1.010–1.025)
UROBILINOGEN UA: 0.2 U/dL

## 2016-05-28 NOTE — Progress Notes (Signed)
   CC: Vaginal discharge   HPI:  [name] is a 46 y.o. woman with PMHx as noted below who presents today for evaluation of vaginal discharge.   She reports 2 days ago she noticed having a white, thick vaginal discharge in her underwear. She describes associated itching and dyspareunia. She denies dysuria but feels she has been urinating more frequently. She denies any bleeding after sexual intercourse. She reports she has been with 1 partner for the last year. Of note, she has been treated for bacterial vaginosis twice and trichomonas once in the past 2 months. She was also given a prescription for her partner to treat the trichomonas but she does not believe he has filled this prescription. She reports some left flank pain, but denies fevers, abdominal pain and pelvic pain, nausea, or vomiting.   Past Medical History:  Diagnosis Date  . Anxiety and depression   . History of hepatitis C     Review of Systems:   General: Denies night sweats, changes in weight, changes in appetite HEENT: Denies headaches, ear pain, changes in vision, rhinorrhea, sore throat CV: Denies CP, palpitations, SOB, orthopnea Pulm: Denies SOB, cough, wheezing GI: Denies diarrhea, constipation, melena, hematochezia GU: Denies dysuria, hematuria, frequency Msk: Denies muscle cramps, joint pains Neuro: Denies weakness, numbness, tingling Skin: Denies rashes, bruising Psych: Denies depression, anxiety, hallucinations  Physical Exam:  Vitals:   05/28/16 1341  BP: 115/81  Pulse: (!) 56  Temp: 97.6 F (36.4 C)  TempSrc: Oral  SpO2: 100%  Weight: 133 lb 6.4 oz (60.5 kg)  Height:  (1.575 m)   General: Alert, cooperative, NAD GU: External genitalia appear normal. Cervix appears normal. There is copious amounts of white discharge in the vaginal vault. No cervical motion tenderness.   Assessment & Plan:   See Encounters Tab for problem based charting.  Patient discussed with Dr. Rogelia Boga

## 2016-05-28 NOTE — Patient Instructions (Signed)
General Instructions: - Reschedule appointment at [facility] - We will treat based on your results   Please bring your medicines with you each time you come to clinic.  Medicines may include prescription medications, over-the-counter medications, herbal remedies, eye drops, vitamins, or other pills.   Progress Toward Treatment Goals:  No flowsheet data found.  Self Care Goals & Plans:  No flowsheet data found.  No flowsheet data found.   Care Management & Community Referrals:  No flowsheet data found.

## 2016-05-28 NOTE — Addendum Note (Signed)
Addended by: Neomia Dear on: 05/28/2016 07:15 PM   Modules accepted: Orders

## 2016-05-29 ENCOUNTER — Telehealth: Payer: Self-pay | Admitting: Internal Medicine

## 2016-05-29 LAB — CERVICOVAGINAL ANCILLARY ONLY
Chlamydia: POSITIVE — AB
Neisseria Gonorrhea: NEGATIVE
Wet Prep (BD Affirm): POSITIVE — AB

## 2016-05-29 MED ORDER — METRONIDAZOLE ER 750 MG PO TB24
750.0000 mg | ORAL_TABLET | Freq: Every day | ORAL | 0 refills | Status: DC
Start: 2016-05-29 — End: 2016-05-30

## 2016-05-29 MED ORDER — AZITHROMYCIN 500 MG PO TABS: 1000.0000 mg | ORAL_TABLET | Freq: Every day | ORAL | 0 refills | Status: DC

## 2016-05-29 NOTE — Telephone Encounter (Signed)
Left message for patient to pick up antibiotics at drug store. If she has any questions she was advised to call the office.

## 2016-05-29 NOTE — Assessment & Plan Note (Addendum)
Patient with a 2 day hx of vaginal discharge in the setting of recently treated trichomonas and bacterial vaginosis infections. Will check GC/chlamydia and wet prep. Patient advised to tell partner that he should fill his Flagyl prescription and be treated as him continuing to be untreated will lead to recurrent infections in her.   Update: Results show +chlamydia and +bacterial vaginosis infections. Will call patient with results. Will prescribe Azithromycin 1g for single dose and Flagyl 750 mg once daily for 7 days. Since this will be her 3rd infection for BV, will discuss that she needs to consider prophylactic metronidazole gel if she has another infection.

## 2016-05-30 ENCOUNTER — Telehealth: Payer: Self-pay | Admitting: *Deleted

## 2016-05-30 ENCOUNTER — Other Ambulatory Visit: Payer: Self-pay | Admitting: Internal Medicine

## 2016-05-30 DIAGNOSIS — A64 Unspecified sexually transmitted disease: Secondary | ICD-10-CM

## 2016-05-30 MED ORDER — AZITHROMYCIN 500 MG PO TABS: 1000.0000 mg | ORAL_TABLET | Freq: Once | ORAL | 0 refills | Status: AC

## 2016-05-30 MED ORDER — METRONIDAZOLE 500 MG PO TABS: 500.0000 mg | ORAL_TABLET | Freq: Three times a day (TID) | ORAL | 0 refills | Status: DC

## 2016-05-30 MED FILL — metroNIDAZOLE 500 MG TABS: 500 | 7 days supply | Qty: 21 | Fill #0

## 2016-05-30 MED FILL — AZITHROMYCIN 500 MG TABLET: 500 | 1 days supply | Qty: 2 | Fill #0

## 2016-05-30 NOTE — Progress Notes (Signed)
Internal Medicine Clinic Attending  Case discussed with Dr. Rivet at the time of the visit.  We reviewed the resident's history and exam and pertinent patient test results.  I agree with the assessment, diagnosis, and plan of care documented in the resident's note.  

## 2016-05-30 NOTE — Telephone Encounter (Signed)
Discussed results with patient. Advised her partner should be treated for chlamydia as well. She agreed to pick up extra prescription for her partner.

## 2016-05-30 NOTE — Telephone Encounter (Signed)
Patient calling asking what kind of meds she was supposed to pick up at pharm. Informed she was prescribed Zithromax & Flagyl & was sent to [facility]. Informed her they are both antibiotics.  She was asking abt vaginal swab test results done last ov. Informed of her PCP appt tomorrow & doctor can inform her of results.  Patient is requesting to be called today if results are in. Thanks!

## 2016-05-30 NOTE — Telephone Encounter (Signed)
Pharmacy informed.

## 2016-05-30 NOTE — Progress Notes (Signed)
Pharmacy does not have extended release Flagyl. Will change to Flagyl to 500 mg TID for 7 days. Also pharmacy refusing to fill Azithromycin for partner.

## 2016-05-31 ENCOUNTER — Encounter: Payer: Medicaid Other | Admitting: Internal Medicine

## 2016-06-21 MED FILL — AZITHROMYCIN 500 MG TABLET: 500 | 1 days supply | Qty: 2 | Fill #0

## 2016-07-23 ENCOUNTER — Ambulatory Visit: Payer: Medicaid Other

## 2016-07-24 ENCOUNTER — Ambulatory Visit: Payer: Medicaid Other

## 2016-08-16 ENCOUNTER — Encounter: Payer: Medicaid Other | Admitting: Internal Medicine

## 2016-08-16 ENCOUNTER — Encounter: Payer: Self-pay | Admitting: Internal Medicine

## 2016-08-20 ENCOUNTER — Ambulatory Visit (INDEPENDENT_AMBULATORY_CARE_PROVIDER_SITE_OTHER): Payer: Medicaid Other | Admitting: Internal Medicine

## 2016-08-20 ENCOUNTER — Other Ambulatory Visit (HOSPITAL_COMMUNITY)
Admission: RE | Admit: 2016-08-20 | Discharge: 2016-08-20 | Disposition: A | Discharge status: Home / Self Care | Payer: Medicaid Other | Source: Ambulatory Visit | Attending: Student in an Organized Health Care Education/Training Program | Admitting: Student in an Organized Health Care Education/Training Program

## 2016-08-20 VITALS — BP 122/71 | HR 59 | Temp 98.1°F | Ht 62.0 in | Wt 130.4 lb

## 2016-08-20 DIAGNOSIS — F419 Anxiety disorder, unspecified: Secondary | ICD-10-CM

## 2016-08-20 DIAGNOSIS — F418 Other specified anxiety disorders: Secondary | ICD-10-CM

## 2016-08-20 DIAGNOSIS — F17210 Nicotine dependence, cigarettes, uncomplicated: Secondary | ICD-10-CM | POA: Diagnosis not present

## 2016-08-20 DIAGNOSIS — Z8619 Personal history of other infectious and parasitic diseases: Secondary | ICD-10-CM | POA: Diagnosis not present

## 2016-08-20 DIAGNOSIS — Z720 Tobacco use: Secondary | ICD-10-CM

## 2016-08-20 DIAGNOSIS — N898 Other specified noninflammatory disorders of vagina: Secondary | ICD-10-CM | POA: Insufficient documentation

## 2016-08-20 DIAGNOSIS — F329 Major depressive disorder, single episode, unspecified: Secondary | ICD-10-CM

## 2016-08-20 LAB — POCT URINALYSIS DIPSTICK
Bilirubin, UA: NEGATIVE
Blood, UA: NEGATIVE
GLUCOSE UA: NEGATIVE
KETONES UA: NEGATIVE
Leukocytes, UA: NEGATIVE
Nitrite, UA: NEGATIVE
Protein, UA: NEGATIVE
SPEC GRAV UA: 1.025 (ref 1.010–1.025)
UROBILINOGEN UA: 0.2 U/dL
pH, UA: 6 (ref 5.0–8.0)

## 2016-08-20 MED ORDER — VARENICLINE TARTRATE 0.5 MG X 11 & 1 MG X 42 PO MISC: ORAL | 0 refills | Status: DC

## 2016-08-20 NOTE — Progress Notes (Signed)
   CC: Lower abdominal pain   HPI:  Susan Herandez is a 46 y.o. F with past medical history as detailed below who presents to the clinic with complaints of foul vaginal odor and lower abdominal pain  Her sx started about 1 week ago, she initially thought it was related to her menstrual period but both the discomfort and abd pain have persisted after menstruation. Her pain is constant in the suprapubic area. She denies vaginal discharge, itching, dysuria, hematuria, increased frequency. She has had recent sexual activity but reports using a condom each time. She has a history of STIs including trichomonas, chlamydia.   She also has complaints of ineffective migraine medications, refill of clonazepam, and chantix.   Past Medical History:  Diagnosis Date  . Anxiety and depression   . History of hepatitis C    Review of Systems:  Review of Systems  Constitutional: Negative for fever.  Cardiovascular: Negative for chest pain.  Gastrointestinal: Positive for abdominal pain.  Genitourinary: Negative for dysuria, frequency, hematuria and urgency.  Neurological: Positive for headaches (chronic migraines).     Physical Exam:  Vitals:   08/20/16 1340  BP: 122/71  Pulse: (!) 59  Temp: 98.1 F (36.7 C)  TempSrc: Oral  SpO2: 100%  Weight: 130 lb 6.4 oz (59.1 kg)  Height: 5\' 2"  (1.575 m)   Physical Exam  Constitutional: She is oriented to person, place, and time. She appears well-developed and well-nourished.  Cardiovascular: Normal rate and regular rhythm.   Pulmonary/Chest: Effort normal and breath sounds normal. She has no wheezes.  Abdominal: Soft. Bowel sounds are normal. She exhibits no distension. There is no rebound and no guarding.  Tenderness to palpation of suprapubic area   Genitourinary:  Genitourinary Comments: No erythema of labia majora or minora, speculum exam revealed thin, white discharge in vaginal vault, no blood, no erythema of vaginal mucosa or cervix     Neurological: She is alert and oriented to person, place, and time.    Assessment & Plan:   See Encounters Tab for problem based charting.  Patient seen with Dr. Heide SparkNarendra

## 2016-08-20 NOTE — Patient Instructions (Addendum)
Thank you for coming in today.   We have sent some labs to identify the specific type of infection this may be and will call in the appropriate medication once we get results back and let you know, hopefully by tomorrow. In the meantime, hold off on sexual activity.   We also put in a refill for Chantix to help you stop smoking again.   For your anxiety, continue your Wellbutrin and schedule an appointment with your PCP, Dr. Mikey BussingHoffman to further discuss this medication.

## 2016-08-20 NOTE — Assessment & Plan Note (Addendum)
Vaginal Odor Assessment: Vaginal odor, though pt reports consistent condom use with sexual partners. She has a history of various STIs in the past, as well as bacterial vaginosis which is the most likely diagnosis with her presentation today. If testing for BV is positive today, she may need prophylactic topical metronidazole to prevent future infections.   Plan: -Wet Prep  -GC -Will notify pt of results and treat appropriately    **Update: Testing was negative for GC, positive for Bacterial vaginosis. This is her 3rd documented case of BV since March 2018, indicating a need for prophylactic therapy to reduce recurrent infections. Attempted to contact the pt, will send a prescription for treatment course of oral Metronidazole and Mtz gel, and try to contact again.

## 2016-08-20 NOTE — Assessment & Plan Note (Signed)
Anxiety Pt notes recent increased anxiety and requests refill of Clonazepam. Also taking Wellbutrin. On review of EMR and [place] Controlled Substance Database. The clonazepam was prescribed as a short term supply to be taken PRN and last about 30 days. Records show the rx was written in March, but not filled till 6/12. Today we will defer management of her anxiety to focus on her acute complaints and maintain continuity with benzodiazepene prescriber. F/u with PCP for further discussion of anxiety

## 2016-08-20 NOTE — Assessment & Plan Note (Signed)
Tobacco abuse Assessment and Plan: Pt had previously stopped smoking but has recently restarted due to stressful situations with her kids. She expresses a desire to quit again and reports Chantix has been effective in the past. Will refill a Chantix pak

## 2016-08-21 ENCOUNTER — Telehealth: Payer: Self-pay | Admitting: Internal Medicine

## 2016-08-21 LAB — CERVICOVAGINAL ANCILLARY ONLY
Chlamydia: NEGATIVE
NEISSERIA GONORRHEA: NEGATIVE

## 2016-08-21 NOTE — Progress Notes (Signed)
Internal Medicine Clinic Attending  I saw and evaluated the patient.  I personally confirmed the key portions of the history and exam documented by Dr. Harden and I reviewed pertinent patient test results.  The assessment, diagnosis, and plan were formulated together and I agree with the documentation in the resident's note.  

## 2016-08-21 NOTE — Telephone Encounter (Signed)
Patient requesting test results

## 2016-08-22 ENCOUNTER — Telehealth: Payer: Self-pay | Admitting: Internal Medicine

## 2016-08-22 LAB — CERVICOVAGINAL ANCILLARY ONLY: WET PREP (BD AFFIRM): POSITIVE — AB

## 2016-08-22 MED ORDER — METRONIDAZOLE 500 MG PO TABS: 500.0000 mg | ORAL_TABLET | Freq: Two times a day (BID) | ORAL | 0 refills | Status: AC

## 2016-08-22 MED ORDER — METRONIDAZOLE 0.75 % VA GEL: 1.0000 | VAGINAL | 6 refills | Status: DC

## 2016-08-22 NOTE — Telephone Encounter (Signed)
PATIENT WANTS HER TEST RESULTS, PLEASE CALL

## 2016-08-22 NOTE — Addendum Note (Signed)
Addended by: Ginger CarneHARDEN, Mattison Stuckey A on: 08/22/2016 03:47 PM   Modules accepted: Orders

## 2016-08-23 ENCOUNTER — Telehealth: Payer: Self-pay

## 2016-08-23 NOTE — Telephone Encounter (Signed)
Per pt she has spoken to dr harden

## 2016-08-23 NOTE — Telephone Encounter (Signed)
Called pt, medicaid would not accept scripts from dr harden, gave them dr vincent's name, it went through then

## 2016-08-23 NOTE — Telephone Encounter (Signed)
Request sent to Dr Anthonette LegatoHarden ; please call pt Thanks

## 2016-08-23 NOTE — Telephone Encounter (Signed)
Questions about med. Please call back.  

## 2016-08-30 ENCOUNTER — Encounter: Payer: Medicaid Other | Admitting: Internal Medicine

## 2016-09-06 ENCOUNTER — Ambulatory Visit (INDEPENDENT_AMBULATORY_CARE_PROVIDER_SITE_OTHER): Payer: Medicaid Other | Admitting: Internal Medicine

## 2016-09-06 DIAGNOSIS — F17210 Nicotine dependence, cigarettes, uncomplicated: Secondary | ICD-10-CM

## 2016-09-06 DIAGNOSIS — Z79899 Other long term (current) drug therapy: Secondary | ICD-10-CM

## 2016-09-06 DIAGNOSIS — F419 Anxiety disorder, unspecified: Secondary | ICD-10-CM

## 2016-09-06 DIAGNOSIS — F329 Major depressive disorder, single episode, unspecified: Secondary | ICD-10-CM | POA: Diagnosis not present

## 2016-09-06 MED ORDER — CLONAZEPAM 0.5 MG PO TABS: 0.5000 mg | ORAL_TABLET | ORAL | 0 refills | Status: DC | PRN

## 2016-09-06 MED ORDER — BUPROPION HCL 100 MG PO TABS: 100.0000 mg | ORAL_TABLET | Freq: Three times a day (TID) | ORAL | 3 refills | Status: DC

## 2016-09-06 NOTE — Progress Notes (Signed)
   CC: Follow-up on depression and anxiety  HPI:  [name] is a 46 y.o. female with history noted below that presents to the internal medicine clinic for follow-up on her anxiety and depression. Please see problem based charting for the status of patient's chronic medical conditions.  Past Medical History:  Diagnosis Date  . Anxiety and depression   . History of hepatitis C     Review of Systems:  Review of Systems  Constitutional: Negative for malaise/fatigue.  Respiratory: Negative for shortness of breath.   Cardiovascular: Negative for chest pain.  Psychiatric/Behavioral: Positive for depression. Negative for suicidal ideas.     Physical Exam:  Vitals:   09/06/16 1546  BP: 108/76  Pulse: (!) 56  Temp: 98.4 F (36.9 C)  TempSrc: Oral  SpO2: 100%  Weight: 132 lb 3.2 oz (60 kg)  Height: 5\' 2"  (1.575 m)   Physical Exam  Cardiovascular: Normal rate, regular rhythm and normal heart sounds.  Exam reveals no gallop and no friction rub.   No murmur heard. Pulmonary/Chest: Effort normal and breath sounds normal. No respiratory distress. She has no wheezes. She has no rales.  Musculoskeletal: She exhibits no edema.    Assessment & Plan:   See encounters tab for problem based medical decision making   Patient discussed with Dr. Criselda PeachesMullen

## 2016-09-06 NOTE — Patient Instructions (Signed)
[name],  It was a pleasure seeing you today. Refills have been sent to the pharmacy. Please follow up in 3 months

## 2016-09-07 ENCOUNTER — Telehealth: Payer: Self-pay | Admitting: *Deleted

## 2016-09-07 NOTE — Telephone Encounter (Signed)
CALLED PATIENT / LVM  TO LET HER KNOW THAT [facility] WILL BE CONTACTING HER TO SET UP APPOINTMENT REGARDING THE REFERRAL THE DR MADE AT OFFICE VISIT.
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
## 2016-09-11 NOTE — Progress Notes (Signed)
Internal Medicine Clinic Attending  Case discussed with Dr. Hoffman at the time of the visit.  We reviewed the resident's history and exam and pertinent patient test results.  I agree with the assessment, diagnosis, and plan of care documented in the resident's note.  

## 2016-09-19 ENCOUNTER — Encounter: Payer: Self-pay | Admitting: *Deleted

## 2016-09-19 ENCOUNTER — Telehealth: Payer: Self-pay | Admitting: *Deleted

## 2016-09-19 NOTE — Telephone Encounter (Signed)
SPOKE WITH CHARLENE AT [facility] [phone] / PATIENT NOT YET SCHEDULED/ OFFICE UNABLE TO GET IN CONTACT WITH PATIENT. THE HAVE LEFT VOICE MESSAGES FOR HER TO CALL BACK. PATIENT HAS NOT RETURNED CALLS.

## 2016-10-10 ENCOUNTER — Other Ambulatory Visit: Payer: Self-pay

## 2016-10-10 DIAGNOSIS — F329 Major depressive disorder, single episode, unspecified: Secondary | ICD-10-CM

## 2016-10-10 DIAGNOSIS — F419 Anxiety disorder, unspecified: Principal | ICD-10-CM

## 2016-10-10 NOTE — Telephone Encounter (Signed)
clonazePAM (KLONOPIN) 0.5 MG tablet, refill request @ [facility] in [place].

## 2016-10-12 ENCOUNTER — Other Ambulatory Visit: Payer: Self-pay | Admitting: Internal Medicine

## 2016-10-12 DIAGNOSIS — F329 Major depressive disorder, single episode, unspecified: Secondary | ICD-10-CM

## 2016-10-12 DIAGNOSIS — F419 Anxiety disorder, unspecified: Principal | ICD-10-CM

## 2016-10-12 DIAGNOSIS — Z720 Tobacco use: Secondary | ICD-10-CM

## 2016-10-12 NOTE — Telephone Encounter (Signed)
Call from pt - needs refill on Klonopin and Chantix.

## 2016-10-16 MED ORDER — VARENICLINE TARTRATE 0.5 MG X 11 & 1 MG X 42 PO MISC: ORAL | 0 refills | Status: DC

## 2016-10-16 NOTE — Telephone Encounter (Signed)
Review of the [place] Controlled Substance database reveals last refill of Klonopin 0.5 mg #15 was 09/06/2016.  Thus, she would be overdue for her refill.    Unfortunately, it looks like she did not schedule her appointment with Behavioral Health as she has not been returning their calls.    I will not refill this medication as the plan was to have the patient followed by Behavioral Health given her history.  It was felt this was in her best interests, rather than just medication, especially with benzos.  If additional controlled substances were prescribed via the [facility] she would first need to submit a urine sample for a UDS.

## 2016-10-18 NOTE — Telephone Encounter (Signed)
Chantix pak rx called to [facility].

## 2016-11-26 ENCOUNTER — Telehealth: Payer: Self-pay | Admitting: *Deleted

## 2016-11-26 NOTE — Telephone Encounter (Signed)
SPOKE WITH PATIENT REGARDING HER REFERRAL WITH [facility]. GAVE PATIENT PHONE NUMBER TO CONTACT OFFICE TO SET UP APPOINTMENT @ [phone] SHE[place]. PATIENT STATES SHE WILL GIVE THEM A CALL.

## 2017-01-24 ENCOUNTER — Other Ambulatory Visit: Payer: Self-pay | Admitting: Internal Medicine

## 2017-01-24 DIAGNOSIS — F419 Anxiety disorder, unspecified: Principal | ICD-10-CM

## 2017-01-24 DIAGNOSIS — F329 Major depressive disorder, single episode, unspecified: Secondary | ICD-10-CM

## 2017-01-24 NOTE — Telephone Encounter (Signed)
Lm for rtc 

## 2017-01-24 NOTE — Telephone Encounter (Signed)
Patient requesting refills, pls call

## 2017-01-29 ENCOUNTER — Encounter: Payer: Self-pay | Admitting: Internal Medicine

## 2017-01-29 ENCOUNTER — Ambulatory Visit: Payer: Medicaid Other

## 2017-01-29 ENCOUNTER — Other Ambulatory Visit: Payer: Self-pay

## 2017-01-29 ENCOUNTER — Ambulatory Visit (INDEPENDENT_AMBULATORY_CARE_PROVIDER_SITE_OTHER): Payer: Self-pay | Admitting: Internal Medicine

## 2017-01-29 ENCOUNTER — Encounter (INDEPENDENT_AMBULATORY_CARE_PROVIDER_SITE_OTHER): Payer: Self-pay

## 2017-01-29 VITALS — BP 105/73 | HR 81 | Temp 98.0°F | Ht 62.0 in | Wt 131.3 lb

## 2017-01-29 DIAGNOSIS — F17211 Nicotine dependence, cigarettes, in remission: Secondary | ICD-10-CM

## 2017-01-29 DIAGNOSIS — Z720 Tobacco use: Secondary | ICD-10-CM

## 2017-01-29 DIAGNOSIS — A64 Unspecified sexually transmitted disease: Secondary | ICD-10-CM

## 2017-01-29 DIAGNOSIS — Z7251 High risk heterosexual behavior: Secondary | ICD-10-CM

## 2017-01-29 DIAGNOSIS — N760 Acute vaginitis: Secondary | ICD-10-CM

## 2017-01-29 DIAGNOSIS — Z8619 Personal history of other infectious and parasitic diseases: Secondary | ICD-10-CM

## 2017-01-29 DIAGNOSIS — N898 Other specified noninflammatory disorders of vagina: Secondary | ICD-10-CM

## 2017-01-29 MED ORDER — METRONIDAZOLE 0.75 % VA GEL: 1.0000 | VAGINAL | 6 refills | Status: DC

## 2017-01-29 MED ORDER — METRONIDAZOLE 500 MG PO TABS: 500.0000 mg | ORAL_TABLET | Freq: Two times a day (BID) | ORAL | 0 refills | Status: AC

## 2017-01-29 MED ORDER — AZITHROMYCIN 500 MG PO TABS: 1000.0000 mg | ORAL_TABLET | Freq: Once | ORAL | 0 refills | Status: AC

## 2017-01-29 MED ORDER — VARENICLINE TARTRATE 0.5 MG X 11 & 1 MG X 42 PO MISC: ORAL | 0 refills | Status: DC

## 2017-01-29 MED ORDER — CEFTRIAXONE SODIUM 250 MG IJ SOLR
250.0000 mg | Freq: Once | INTRAMUSCULAR | Status: AC
  Administered 2017-01-29: 250 mg via INTRAMUSCULAR

## 2017-01-29 NOTE — Assessment & Plan Note (Signed)
Assessment: She previously had stopped smoking and had success with Chantix.  She would like a refill of this in an effort to stop smoking again.  Plan: - Refill given of Chantix

## 2017-01-29 NOTE — Assessment & Plan Note (Signed)
Assessment: Patient here requesting treatment for STI as well as BV.  Discussed with her that we would like to perform a pelvic exam to obtain proper specimen for GC/CT testing as well as BV and trichomonas.  She declines this type of testing and instead requests that we go ahead and treat her empirically for the above.  She is agreeable to having serum drawn for HIV and RPR.  She further declined urine cytology testing for GC/CT, BV, and trichomonas.  Plan: - despite efforts to explain benefit of treatment based off test results, patient declined and requested empiric treatment - ceftriaxone 250mg  IM given in clinic - Rx sent for azithromycin 1000mg  x 1 dose - Rx sent for metronidazole 500mg  BID x 7 days - Check serum for HIV and RPR - Return precautions given

## 2017-01-29 NOTE — Progress Notes (Signed)
   CC: vaginal odor  HPI:  [name] is a 46 y.o. woman with PMHx as below as well as prior STI, multiple episodes of bacterial vaginosis here requesting treatment for STI.  Patient reports that she has had a "fishy odor" for several days consistent with her prior BV episodes.  She also reports LMP was last week as normal and she reportedly started having bleeding again today.  She has not been using the metronidazole vaginal cream previously prescribed by Dr [name] for BV prophylaxis.  She reports 1 new partner recently.  They do not use protection and she is aware that he is sexually active with other partners.  She reports abdominal discomfort associated with her period but denies fever, chills, discharge, or dysuria.  She is here today with her grandchildren.  Past Medical History:  Diagnosis Date  . Anxiety and depression   . History of hepatitis C    Review of Systems:  Please see pertinent ROS reviewed in HPI and problem based charting.   Physical Exam:  Vitals:   01/29/17 1518  BP: 105/73  Pulse: 81  Temp: 98 F (36.7 C)  TempSrc: Oral  SpO2: 97%  Weight: 131 lb 4.8 oz (59.6 kg)  Height: 5\' 2"  (1.575 m)   Physical Exam  Constitutional: She is oriented to person, place, and time. She appears well-developed and well-nourished.  Non-toxic appearance. She does not appear ill.  HENT:  Head: Normocephalic and atraumatic.  Pulmonary/Chest: Effort normal.  Neurological: She is alert and oriented to person, place, and time.  Skin: Skin is warm and dry.  Psychiatric: She has a normal mood and affect. Her behavior is normal.     Assessment & Plan:   See Encounters Tab for problem based charting.  Patient discussed with Dr. Oswaldo Lang.  STD (female) Assessment: Patient here requesting treatment for STI as well as BV.  Discussed with her that we would like to perform a pelvic exam to obtain proper specimen for GC/CT testing as well as BV and trichomonas.  She declines this type  of testing and instead requests that we go ahead and treat her empirically for the above.  She is agreeable to having serum drawn for HIV and RPR.  She further declined urine cytology testing for GC/CT, BV, and trichomonas.  Plan: - despite efforts to explain benefit of treatment based off test results, patient declined and requested empiric treatment - ceftriaxone 250mg  IM given in clinic - Rx sent for azithromycin 1000mg  x 1 dose - Rx sent for metronidazole 500mg  BID x 7 days - Check serum for HIV and RPR - Return precautions given  Tobacco abuse Assessment: She previously had stopped smoking and had success with Chantix.  She would like a refill of this in an effort to stop smoking again.  Plan: - Refill given of Chantix  High risk sexual behavior She is engaged in high risk sexual behavior with prior history of treated STI.  Her last HIV was negative a couple years ago and we are checking HIV Ab again today.  We briefly discussed PrEP today to prevent future HIV infection given her high risk behavior.  She was interested in further discussion of this but was distracted and overwhelmed at having her 2 grandchildren present with her during today's visit to make any decision on this medication.  I recommended that she discuss further at her next PCP appointment.

## 2017-01-29 NOTE — Patient Instructions (Signed)
FOLLOW-UP INSTRUCTIONS When: at next available with Dr Mikey BussingHoffman For: continued discussion regarding STD prevention What to bring: current medications  We treated you empirically for STD and for bacterial vaginosis.  You have prescriptions sent to your pharmacy that you requested.

## 2017-01-29 NOTE — Assessment & Plan Note (Signed)
She is engaged in high risk sexual behavior with prior history of treated STI.  Her last HIV was negative a couple years ago and we are checking HIV Ab again today.  We briefly discussed PrEP today to prevent future HIV infection given her high risk behavior.  She was interested in further discussion of this but was distracted and overwhelmed at having her 2 grandchildren present with her during today's visit to make any decision on this medication.  I recommended that she discuss further at her next PCP appointment.

## 2017-01-30 LAB — RPR: RPR: NONREACTIVE

## 2017-01-30 LAB — HIV ANTIBODY (ROUTINE TESTING W REFLEX): HIV Screen 4th Generation wRfx: NONREACTIVE

## 2017-01-30 NOTE — Progress Notes (Signed)
Internal Medicine Clinic Attending  Case discussed with Dr. Earlene PlaterWallace  at the time of the visit.  We reviewed the resident's history and exam and pertinent patient test results.  I agree with the assessment, diagnosis, and plan of care documented in the resident's note.  Patient is an excellent candidate for PREP to prevent HIV infection. We have offered, and she is considering. Will let us know if she wants to move forward and we can prescribe from [facility].

## 2017-01-31 NOTE — Telephone Encounter (Signed)
Pt states she cant remember when she was seen it was 1 wk to 2 wks ago [phone] Seen by Wynona Neatjamilla wilson Given handbook and told they would call her with an appt, I have ask pt to please touch base with the provider tomorrow am and check on appt

## 2017-01-31 NOTE — Telephone Encounter (Signed)
Have not refilled in 4 months.  Patient was asked to follow up with psychiatry for psych meds.  Did patient see a psychiatrist?

## 2017-03-01 ENCOUNTER — Other Ambulatory Visit (HOSPITAL_COMMUNITY)
Admission: RE | Admit: 2017-03-01 | Discharge: 2017-03-01 | Disposition: A | Discharge status: Home / Self Care | Payer: Medicaid Other | Source: Ambulatory Visit | Attending: Oncology | Admitting: Oncology

## 2017-03-01 ENCOUNTER — Other Ambulatory Visit: Payer: Self-pay

## 2017-03-01 ENCOUNTER — Ambulatory Visit (INDEPENDENT_AMBULATORY_CARE_PROVIDER_SITE_OTHER): Payer: Self-pay | Admitting: Internal Medicine

## 2017-03-01 VITALS — BP 113/81 | HR 59 | Temp 98.0°F | Ht 62.0 in | Wt 130.0 lb

## 2017-03-01 DIAGNOSIS — N898 Other specified noninflammatory disorders of vagina: Secondary | ICD-10-CM | POA: Insufficient documentation

## 2017-03-01 DIAGNOSIS — N760 Acute vaginitis: Secondary | ICD-10-CM | POA: Insufficient documentation

## 2017-03-01 DIAGNOSIS — B9689 Other specified bacterial agents as the cause of diseases classified elsewhere: Secondary | ICD-10-CM | POA: Insufficient documentation

## 2017-03-01 DIAGNOSIS — Z8619 Personal history of other infectious and parasitic diseases: Secondary | ICD-10-CM

## 2017-03-01 DIAGNOSIS — Z8742 Personal history of other diseases of the female genital tract: Secondary | ICD-10-CM

## 2017-03-01 NOTE — Progress Notes (Signed)
   CC: vaginal odor  HPI:  [name] is a 47 y.o. female with history noted below that presents to the acute care clinic for one-month history of vaginal odor.  Please see problem based charting for the status of patient's chronic medical conditions.  Past Medical History:  Diagnosis Date  . Anxiety and depression   . History of hepatitis C     Review of Systems:  Review of Systems  Constitutional: Negative for chills and fever.  Gastrointestinal: Negative for nausea and vomiting.  Genitourinary: Negative for dysuria, flank pain, frequency and urgency.     Physical Exam:  Vitals:   03/01/17 1617  BP: 113/81  Pulse: (!) 59  Temp: 98 F (36.7 C)  TempSrc: Oral  SpO2: 98%  Weight: 130 lb (59 kg)  Height: 5\' 2"  (1.575 m)   Physical Exam  Constitutional: She is well-developed, well-nourished, and in no distress.  Genitourinary: Vagina normal and cervix normal. No vaginal discharge found.  Skin: Skin is warm and dry. No erythema.    Assessment & Plan:   See encounters tab for problem based medical decision making.   Patient discussed with Dr. Cyndie ChimeGranfortuna

## 2017-03-01 NOTE — Patient Instructions (Signed)
[name],  It was a pleasure seeing you today.  I will call you with the results of your exam.

## 2017-03-01 NOTE — Assessment & Plan Note (Signed)
Assessment:  Vaginal odor   Patient has a history of BV, trichomonas and chlamydia in the past year.   Patient was recently seen in the clinic on 12/18 for vaginal odor.  At that time she declined pelvic exam and was treated empirically with ceftriaxone, azithromycin and metronidazole. Today patient presents again with increased vaginal odor. Patient states that she has been with the same partner for the past 6 months and consistently experiences increased vaginal odor and some minimal watery/white discharge.  She denies dysuria or vaginal purities.  On exam cervix appeared normal with no discharge.  Will do pelvic exam today.  Also patient has been prescribed metronidazole gel to help with prophylactic treatment of BV, however patient states that due to lack of insurance does not take the medication  Plan -pelvic exam -recommended following up with [facility] for insurance counseling

## 2017-03-04 LAB — CERVICOVAGINAL ANCILLARY ONLY
BACTERIAL VAGINITIS: POSITIVE — AB
Candida vaginitis: NEGATIVE
Chlamydia: NEGATIVE
NEISSERIA GONORRHEA: NEGATIVE
TRICH (WINDOWPATH): NEGATIVE

## 2017-03-04 NOTE — Progress Notes (Signed)
Medicine attending: Medical history, presenting problems, physical findings, and medications, reviewed with resident physician Dr Geralyn CorwinJessica Hoffman on the day of the patient visit and I concur with her evaluation and management plan. No evidence of re-infection. No indication to start another course of antibiotics unless new infection confirmed with current cultures.

## 2017-03-05 ENCOUNTER — Other Ambulatory Visit: Payer: Self-pay | Admitting: Internal Medicine

## 2017-03-05 ENCOUNTER — Telehealth: Payer: Self-pay | Admitting: Internal Medicine

## 2017-03-05 MED ORDER — METRONIDAZOLE 500 MG PO TABS: 500.0000 mg | ORAL_TABLET | Freq: Two times a day (BID) | ORAL | 0 refills | Status: AC

## 2017-03-05 NOTE — Telephone Encounter (Signed)
rtc to both#'s to give dr Neita Garnethoffman's message, lm for rtc

## 2017-03-05 NOTE — Telephone Encounter (Signed)
PATIENT WANTS RESULTS FOR TEST DONE LAST WEEK

## 2017-03-07 ENCOUNTER — Encounter: Payer: Medicaid Other | Admitting: Internal Medicine
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
## 2017-09-17 ENCOUNTER — Other Ambulatory Visit: Payer: Self-pay

## 2017-09-17 ENCOUNTER — Emergency Department (HOSPITAL_BASED_OUTPATIENT_CLINIC_OR_DEPARTMENT_OTHER)
Admission: EM | Admit: 2017-09-17 | Discharge: 2017-09-17 | Disposition: A | Discharge status: Home / Self Care | Payer: Medicaid Other | Attending: Emergency Medicine | Admitting: Emergency Medicine

## 2017-09-17 ENCOUNTER — Encounter (HOSPITAL_BASED_OUTPATIENT_CLINIC_OR_DEPARTMENT_OTHER): Payer: Self-pay

## 2017-09-17 DIAGNOSIS — L509 Urticaria, unspecified: Secondary | ICD-10-CM | POA: Insufficient documentation

## 2017-09-17 DIAGNOSIS — B3731 Acute candidiasis of vulva and vagina: Secondary | ICD-10-CM

## 2017-09-17 DIAGNOSIS — R0981 Nasal congestion: Secondary | ICD-10-CM

## 2017-09-17 DIAGNOSIS — F17210 Nicotine dependence, cigarettes, uncomplicated: Secondary | ICD-10-CM | POA: Insufficient documentation

## 2017-09-17 DIAGNOSIS — B373 Candidiasis of vulva and vagina: Secondary | ICD-10-CM | POA: Insufficient documentation

## 2017-09-17 DIAGNOSIS — N760 Acute vaginitis: Secondary | ICD-10-CM | POA: Insufficient documentation

## 2017-09-17 DIAGNOSIS — B9689 Other specified bacterial agents as the cause of diseases classified elsewhere: Secondary | ICD-10-CM | POA: Insufficient documentation

## 2017-09-17 DIAGNOSIS — Z79899 Other long term (current) drug therapy: Secondary | ICD-10-CM | POA: Insufficient documentation

## 2017-09-17 LAB — URINALYSIS, ROUTINE W REFLEX MICROSCOPIC
Bilirubin Urine: NEGATIVE
GLUCOSE, UA: NEGATIVE mg/dL
HGB URINE DIPSTICK: NEGATIVE
Ketones, ur: NEGATIVE mg/dL
Nitrite: NEGATIVE
PH: 8 (ref 5.0–8.0)
Protein, ur: NEGATIVE mg/dL
Specific Gravity, Urine: 1.015 (ref 1.005–1.030)

## 2017-09-17 LAB — URINALYSIS, MICROSCOPIC (REFLEX)

## 2017-09-17 LAB — PREGNANCY, URINE: Preg Test, Ur: NEGATIVE

## 2017-09-17 LAB — WET PREP, GENITAL
Sperm: NONE SEEN
Trich, Wet Prep: NONE SEEN

## 2017-09-17 MED ORDER — CEFTRIAXONE SODIUM 250 MG IJ SOLR
250.0000 mg | Freq: Once | INTRAMUSCULAR | Status: AC
  Administered 2017-09-17: 250 mg via INTRAMUSCULAR
  Filled 2017-09-17: qty 250

## 2017-09-17 MED ORDER — FLUTICASONE PROPIONATE 50 MCG/ACT NA SUSP: 2.0000 | Freq: Every day | NASAL | 0 refills | Status: DC

## 2017-09-17 MED ORDER — FLUCONAZOLE 50 MG PO TABS
150.0000 mg | ORAL_TABLET | Freq: Once | ORAL | Status: AC
  Administered 2017-09-17: 150 mg via ORAL
  Filled 2017-09-17: qty 1

## 2017-09-17 MED ORDER — METRONIDAZOLE 500 MG PO TABS
500.0000 mg | ORAL_TABLET | Freq: Once | ORAL | Status: AC
  Administered 2017-09-17: 500 mg via ORAL
  Filled 2017-09-17: qty 1

## 2017-09-17 MED ORDER — CARBAMIDE PEROXIDE 6.5 % OT SOLN: 5.0000 [drp] | Freq: Two times a day (BID) | OTIC | 0 refills | Status: DC

## 2017-09-17 MED ORDER — METRONIDAZOLE 500 MG PO TABS: 500.0000 mg | ORAL_TABLET | Freq: Three times a day (TID) | ORAL | 0 refills | Status: DC

## 2017-09-17 MED ORDER — AZITHROMYCIN 1 G PO PACK
1.0000 g | PACK | Freq: Once | ORAL | Status: AC
  Administered 2017-09-17: 1 g via ORAL
  Filled 2017-09-17: qty 1

## 2017-09-17 MED ORDER — HYDROCORTISONE 1 % EX CREA: TOPICAL_CREAM | CUTANEOUS | 0 refills | Status: DC

## 2017-09-17 MED ORDER — LORATADINE 10 MG PO TABS: 10.0000 mg | ORAL_TABLET | Freq: Every day | ORAL | 0 refills | Status: DC

## 2017-09-17 NOTE — ED Notes (Signed)
Urine sample and culture taken to the lab
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
## 2017-09-17 NOTE — ED Provider Notes (Signed)
[facility] EMERGENCY DEPARTMENT Provider Note   CSN: [id number] Arrival date & time: 09/17/17  1833     History   Chief Complaint Chief Complaint  Patient presents with  . Vaginal Discharge    HPI [name] is a 47 y.o. female previous history of STD, anxiety and depression here presenting with vaginal discharge, itchiness.  Patient states that she had unprotected sex with her ex-boyfriend about a week ago.  She started having itchiness in the vaginal area about 3 days ago.  Since yesterday she noticed some whitish discharge from her vagina.  Denies any fevers or chills or vomiting.  She states that she does have a history of bacterial vaginosis.  She does not know if he has any STDs or not.  The history is provided by the patient.    Past Medical History:  Diagnosis Date  . Anxiety and depression   . History of hepatitis C     Patient Active Problem List   Diagnosis Date Noted  . High risk sexual behavior 01/29/2017  . Vaginal odor 08/20/2016  . Tobacco abuse 03/27/2016  . Encounter for screening mammogram for breast cancer 03/27/2016  . STD (female) 03/19/2016  . Anxiety and depression 03/18/2016  . History of hepatitis C     Past Surgical History:  Procedure Laterality Date  . AUGMENTATION MAMMAPLASTY Bilateral   . BREAST ENHANCEMENT SURGERY    . CESAREAN SECTION    . MYRINGOTOMY       OB History    Gravida  1   Para      Term      Preterm      AB      Living        SAB      TAB      Ectopic      Multiple      Live Births               Home Medications    Prior to Admission medications   Medication Sig Start Date End Date Taking? Authorizing Provider  buPROPion (WELLBUTRIN) 100 MG tablet Take 1 tablet (100 mg total) by mouth 3 (three) times daily. 09/06/16 09/06/17  Geralyn CorwinHoffman, Jessica Ratliff, DO  clonazePAM (KLONOPIN) 0.5 MG tablet Take 1 tablet (0.5 mg total) by mouth as needed for anxiety (severe anxiety). 09/06/16   Geralyn CorwinHoffman,  Jessica Ratliff, DO  metroNIDAZOLE (METROGEL VAGINAL) 0.75 % vaginal gel Place 1 Applicatorful vaginally 2 (two) times a week. 01/31/17   Gwynn BurlyWallace, Andrew, DO  SUMAtriptan (IMITREX) 25 MG tablet Take 1 tablet (25 mg total) by mouth every 2 (two) hours as needed for migraine. May repeat in 2 hours if headache persists or recurs. 03/19/16   Burns, Tinnie GensAlexa R, MD  varenicline (CHANTIX PAK) 0.5 MG X 11 & 1 MG X 42 tablet Take one 0.5 mg tablet by mouth once daily for 3 days, then increase to one 0.5 mg tablet twice daily for 4 days, then increase to one 1 mg tablet twice daily. 01/29/17   Gwynn BurlyWallace, Andrew, DO    Family History Family History  Problem Relation Age of Onset  . Cancer Other   . Ovarian cancer Maternal Aunt        deceased    Social History Social History   Tobacco Use  . Smoking status: Current Some Day Smoker    Packs/day: 1.00    Years: 30.00    Pack years: 30.00    Types: Cigarettes  .  Smokeless tobacco: Never Used  Substance Use Topics  . Alcohol use: No    Frequency: Never  . Drug use: Yes    Types: Marijuana, Cocaine     Allergies   Tylenol [acetaminophen]   Review of Systems Review of Systems  Genitourinary: Positive for vaginal discharge.  All other systems reviewed and are negative.    Physical Exam Updated Vital Signs BP 122/83 (BP Location: Left Arm)   Pulse 73   Temp 98.2 F (36.8 C) (Oral)   Resp 18   Ht 5\' 1"  (1.549 m)   Wt 55.8 kg (123 lb)   LMP  (LMP Unknown)   SpO2 98%   BMI 23.24 kg/m   Physical Exam  Constitutional: She appears well-developed.  Anxious   HENT:  Head: Normocephalic.  Eyes: Pupils are equal, round, and reactive to light. Conjunctivae and EOM are normal.  Neck: Normal range of motion. Neck supple.  Cardiovascular: Normal rate, regular rhythm and normal heart sounds.  Pulmonary/Chest: Effort normal and breath sounds normal. No stridor. No respiratory distress.  Abdominal: Soft. Bowel sounds are normal. She exhibits  no distension. There is no tenderness.  Genitourinary:  Genitourinary Comments: No obvious lesions around the external genitalia. There is whitish vaginal discharge. No CMT or adnexal tenderness or uterine tenderness   Neurological: She is alert.  Skin: Skin is warm.  Psychiatric: She has a normal mood and affect.  Nursing note and vitals reviewed.    ED Treatments / Results  Labs (all labs ordered are listed, but only abnormal results are displayed) Labs Reviewed  WET PREP, GENITAL - Abnormal; Notable for the following components:      Result Value   Yeast Wet Prep HPF POC PRESENT (*)    Clue Cells Wet Prep HPF POC PRESENT (*)    WBC, Wet Prep HPF POC MANY (*)    All other components within normal limits  URINALYSIS, ROUTINE W REFLEX MICROSCOPIC - Abnormal; Notable for the following components:   APPearance CLOUDY (*)    Leukocytes, UA SMALL (*)    All other components within normal limits  URINALYSIS, MICROSCOPIC (REFLEX) - Abnormal; Notable for the following components:   Bacteria, UA FEW (*)    All other components within normal limits  PREGNANCY, URINE  GC/CHLAMYDIA PROBE AMP ([place]) NOT AT [facility]    EKG None  Radiology No results found.  Procedures Procedures (including critical care time)  Medications Ordered in ED Medications  cefTRIAXone (ROCEPHIN) injection 250 mg (250 mg Intramuscular Given 09/17/17 1916)  azithromycin (ZITHROMAX) powder 1 g (1 g Oral Given 09/17/17 1916)  fluconazole (DIFLUCAN) tablet 150 mg (150 mg Oral Given 09/17/17 1914)  metroNIDAZOLE (FLAGYL) tablet 500 mg (500 mg Oral Given 09/17/17 1914)     Initial Impression / Assessment and Plan / ED Course  I have reviewed the triage vital signs and the nursing notes.  Pertinent labs & imaging results that were available during my care of the patient were reviewed by me and considered in my medical decision making (see chart for details).     [name] is a 47 y.o. female here with vaginal  discharge after unprotected sex. She has no signs of PID. Wet prep, UA, GC/chlamydia sent. Patient request empiric treatment.   7:49 PM UCG neg. Wet prep + yeast, clue cells. Given rocephin, azithromycin, diflucan, flagyl. Patient also states that she has rash bilateral shins, which appears mild urticaria so I will give hydrocortisone cream. Also has bilateral ear  fullness and sinus congestion. Bilateral TM with effusion behind it but no otitis media so will give claritin, flonase.    Final Clinical Impressions(s) / ED Diagnoses   Final diagnoses:  None    ED Discharge Orders    None       Charlynne Pander, MD 09/17/17 [phone]

## 2017-09-17 NOTE — Discharge Instructions (Addendum)
Please make sure your partner uses protection   Take flagyl three times daily for a week for Bacterial vaginosis. Don't drink alcohol when you take it   You have yeast as well but we gave you treatment already   Use flonase and claritin for congestion and ear pain   You can use debrox to help with your ear wax.   Use hydrocortisone cream to your leg to help with itchiness. Take benadryl 25 mg every 6 hrs as well for itchiness  See your doctor  We sent off GC/chlamydia. If the tests are positive, you will be called and your partner needs treatment as well   Return to ER if you have worse discharge, fever, abdominal pain, vomiting, worse rash

## 2017-09-17 NOTE — ED Notes (Signed)
Pt verbalizes understanding of d/c instructions and denies any further needs at this time. 

## 2017-09-18 LAB — GC/CHLAMYDIA PROBE AMP (~~LOC~~) NOT AT ARMC
CHLAMYDIA, DNA PROBE: NEGATIVE
NEISSERIA GONORRHEA: NEGATIVE

## 2018-01-30 ENCOUNTER — Other Ambulatory Visit: Payer: Self-pay | Admitting: Internal Medicine

## 2018-02-15 ENCOUNTER — Inpatient Hospital Stay (HOSPITAL_COMMUNITY)
Admission: AD | Admit: 2018-02-15 | Discharge: 2018-02-15 | Discharge status: Absent without leave | Payer: Medicaid Other | Attending: Obstetrics & Gynecology | Admitting: Obstetrics & Gynecology

## 2018-02-19 ENCOUNTER — Emergency Department (HOSPITAL_BASED_OUTPATIENT_CLINIC_OR_DEPARTMENT_OTHER)
Admission: EM | Admit: 2018-02-19 | Discharge: 2018-02-19 | Disposition: A | Discharge status: Home / Self Care | Payer: Medicaid Other | Attending: Emergency Medicine | Admitting: Emergency Medicine

## 2018-02-19 ENCOUNTER — Encounter (HOSPITAL_BASED_OUTPATIENT_CLINIC_OR_DEPARTMENT_OTHER): Payer: Self-pay | Admitting: Emergency Medicine

## 2018-02-19 ENCOUNTER — Other Ambulatory Visit: Payer: Self-pay

## 2018-02-19 DIAGNOSIS — T754XXA Electrocution, initial encounter: Secondary | ICD-10-CM

## 2018-02-19 DIAGNOSIS — F17210 Nicotine dependence, cigarettes, uncomplicated: Secondary | ICD-10-CM | POA: Insufficient documentation

## 2018-02-19 DIAGNOSIS — R200 Anesthesia of skin: Secondary | ICD-10-CM | POA: Insufficient documentation

## 2018-02-19 MED ORDER — PREDNISONE 10 MG PO TABS: 20.0000 mg | ORAL_TABLET | Freq: Two times a day (BID) | ORAL | 0 refills | Status: DC
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
## 2018-02-19 NOTE — Discharge Instructions (Signed)
Prednisone as prescribed.  Follow-up with neurology if your symptoms persist to discuss nerve conduction studies.  The contact information for [facility] has been provided in this discharge summary for you to call and make these arrangements.

## 2018-02-19 NOTE — ED Notes (Signed)
Patient is A&Ox4.  No signs of distress noted.  Please see providers complete history and physical exam.  
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
## 2018-02-19 NOTE — ED Notes (Signed)
PT states understanding of care given, follow up care, and medication prescribed. PT ambulated from ED to car with a steady gait. 

## 2018-03-04 ENCOUNTER — Ambulatory Visit: Payer: Self-pay | Admitting: Neurology

## 2018-03-05 ENCOUNTER — Encounter: Payer: Self-pay | Admitting: Neurology

## 2018-04-24 ENCOUNTER — Emergency Department (HOSPITAL_BASED_OUTPATIENT_CLINIC_OR_DEPARTMENT_OTHER)
Admission: EM | Admit: 2018-04-24 | Discharge: 2018-04-24 | Disposition: A | Discharge status: Home / Self Care | Payer: Medicaid Other | Attending: Emergency Medicine | Admitting: Emergency Medicine

## 2018-04-24 ENCOUNTER — Other Ambulatory Visit: Payer: Self-pay

## 2018-04-24 ENCOUNTER — Encounter (HOSPITAL_BASED_OUTPATIENT_CLINIC_OR_DEPARTMENT_OTHER): Payer: Self-pay | Admitting: Emergency Medicine

## 2018-04-24 DIAGNOSIS — Z79899 Other long term (current) drug therapy: Secondary | ICD-10-CM | POA: Insufficient documentation

## 2018-04-24 DIAGNOSIS — H60501 Unspecified acute noninfective otitis externa, right ear: Secondary | ICD-10-CM | POA: Insufficient documentation

## 2018-04-24 DIAGNOSIS — F329 Major depressive disorder, single episode, unspecified: Secondary | ICD-10-CM | POA: Insufficient documentation

## 2018-04-24 DIAGNOSIS — F17210 Nicotine dependence, cigarettes, uncomplicated: Secondary | ICD-10-CM | POA: Insufficient documentation

## 2018-04-24 DIAGNOSIS — F419 Anxiety disorder, unspecified: Secondary | ICD-10-CM | POA: Insufficient documentation

## 2018-04-24 MED ORDER — CIPROFLOXACIN-DEXAMETHASONE 0.3-0.1 % OT SUSP: 4.0000 [drp] | Freq: Two times a day (BID) | OTIC | 0 refills | Status: DC

## 2018-04-24 MED ORDER — LIDOCAINE VISCOUS HCL 2 % MT SOLN
OROMUCOSAL | Status: AC
  Administered 2018-04-24: 15 mL
  Filled 2018-04-24: qty 15
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
## 2018-04-24 NOTE — ED Provider Notes (Signed)
[facility] EMERGENCY DEPARTMENT Provider Note   CSN: [id number] Arrival date & time: 04/24/18  0539    History   Chief Complaint Chief Complaint  Patient presents with  . Otalgia    HPI [name] is a 48 y.o. female.     The history is provided by the patient.  Otalgia  Location:  Right Behind ear:  No abnormality Quality:  Throbbing (movement) Severity:  Severe Onset quality:  Gradual Timing:  Constant Progression:  Unchanged Chronicity:  New Context: not direct blow, not recent URI and not water in ear   Context comment:  She thinks there is a bug in the ear Relieved by:  Nothing Worsened by:  Nothing Ineffective treatments:  None tried Associated symptoms: no abdominal pain, no congestion, no diarrhea, no fever, no neck pain, no sore throat, no tinnitus and no vomiting     Past Medical History:  Diagnosis Date  . Anxiety and depression   . History of hepatitis C     Patient Active Problem List   Diagnosis Date Noted  . High risk sexual behavior 01/29/2017  . Vaginal odor 08/20/2016  . Tobacco abuse 03/27/2016  . Encounter for screening mammogram for breast cancer 03/27/2016  . STD (female) 03/19/2016  . Anxiety and depression 03/18/2016  . History of hepatitis C     Past Surgical History:  Procedure Laterality Date  . AUGMENTATION MAMMAPLASTY Bilateral   . BREAST ENHANCEMENT SURGERY    . CESAREAN SECTION    . MYRINGOTOMY       OB History    Gravida  1   Para      Term      Preterm      AB      Living        SAB      TAB      Ectopic      Multiple      Live Births               Home Medications    Prior to Admission medications   Medication Sig Start Date End Date Taking? Authorizing Provider  ciprofloxacin-dexamethasone (CIPRODEX) OTIC suspension Place 4 drops into the right ear 2 (two) times daily. 04/24/18   Jolayne Branson, MD  predniSONE (DELTASONE) 10 MG tablet Take 2 tablets (20 mg total) by mouth 2  (two) times daily with a meal. 02/19/18   Geoffery Lyons, MD    Family History Family History  Problem Relation Age of Onset  . Cancer Other   . Ovarian cancer Maternal Aunt        deceased    Social History Social History   Tobacco Use  . Smoking status: Current Every Day Smoker    Packs/day: 0.50    Years: 30.00    Pack years: 15.00    Types: Cigarettes  . Smokeless tobacco: Never Used  Substance Use Topics  . Alcohol use: No    Frequency: Never  . Drug use: Not Currently    Types: Marijuana, Cocaine     Allergies   Tylenol [acetaminophen]   Review of Systems Review of Systems  Constitutional: Negative for fever.  HENT: Positive for ear pain. Negative for congestion, facial swelling, sore throat and tinnitus.   Gastrointestinal: Negative for abdominal pain, diarrhea and vomiting.  Musculoskeletal: Negative for neck pain.  All other systems reviewed and are negative.    Physical Exam Updated Vital Signs BP (!) 152/109   Pulse 82  Temp 97.9 F (36.6 C) (Oral)   Resp 16   Ht 5\' 2"  (1.575 m)   Wt 52.2 kg   SpO2 100%   BMI 21.03 kg/m   Physical Exam Vitals signs and nursing note reviewed.  Constitutional:      Appearance: Normal appearance.  HENT:     Head: Normocephalic and atraumatic.     Comments: Debris in the right canal consistent with otitis externa, linear abrasion posterior aspect of the Right canal.  TM intact no foreign body    Mouth/Throat:     Mouth: Mucous membranes are moist.     Pharynx: Oropharynx is clear.  Eyes:     Conjunctiva/sclera: Conjunctivae normal.     Pupils: Pupils are equal, round, and reactive to light.  Neck:     Musculoskeletal: Normal range of motion and neck supple.  Cardiovascular:     Rate and Rhythm: Normal rate and regular rhythm.     Pulses: Normal pulses.     Heart sounds: Normal heart sounds.  Pulmonary:     Effort: Pulmonary effort is normal.     Breath sounds: Normal breath sounds.  Abdominal:      General: Abdomen is flat. Bowel sounds are normal.     Tenderness: There is no abdominal tenderness. There is no guarding.  Musculoskeletal: Normal range of motion.  Skin:    General: Skin is warm and dry.     Capillary Refill: Capillary refill takes less than 2 seconds.  Neurological:     General: No focal deficit present.     Mental Status: She is alert and oriented to person, place, and time.  Psychiatric:        Mood and Affect: Mood normal.      ED Treatments / Results  Labs (all labs ordered are listed, but only abnormal results are displayed) Labs Reviewed - No data to display  EKG None  Radiology No results found.  Procedures Procedures (including critical care time)  Medications Ordered in ED Medications  lidocaine (XYLOCAINE) 2 % viscous mouth solution (15 mLs  Given by Other 04/24/18 0610)       Final Clinical Impressions(s) / ED Diagnoses   Final diagnoses:  Acute otitis externa of right ear, unspecified type    Return for intractable cough, coughing up blood,fevers >100.4 unrelieved by medication, shortness of breath, intractable vomiting, chest pain, shortness of breath, weakness,numbness, changes in speech, facial asymmetry,abdominal pain, passing out,Inability to tolerate liquids or food, cough, altered mental status or any concerns. No signs of systemic illness or infection. The patient is nontoxic-appearing on exam and vital signs are within normal limits.   I have reviewed the triage vital signs and the nursing notes. Pertinent labs &imaging results that were available during my care of the patient were reviewed by me and considered in my medical decision making (see chart for details).  After history, exam, and medical workup I feel the patient has been appropriately medically screened and is safe for discharge home. Pertinent diagnoses were discussed with the patient. Patient was given return precautions.  ED Discharge Orders          Ordered    ciprofloxacin-dexamethasone (CIPRODEX) OTIC suspension  2 times daily     04/24/18 0614           Lillian Ballester, MD 04/24/18 9735

## 2018-04-27 ENCOUNTER — Telehealth (HOSPITAL_BASED_OUTPATIENT_CLINIC_OR_DEPARTMENT_OTHER): Payer: Self-pay | Admitting: Emergency Medicine

## 2018-08-11 ENCOUNTER — Encounter: Payer: Self-pay | Admitting: *Deleted

## 2018-12-04 ENCOUNTER — Ambulatory Visit (INDEPENDENT_AMBULATORY_CARE_PROVIDER_SITE_OTHER): Payer: Self-pay | Admitting: Internal Medicine

## 2018-12-04 ENCOUNTER — Other Ambulatory Visit: Payer: Self-pay

## 2018-12-04 ENCOUNTER — Encounter: Payer: Self-pay | Admitting: Internal Medicine

## 2018-12-04 VITALS — BP 124/85 | HR 70 | Temp 98.9°F | Ht 63.0 in | Wt 119.9 lb

## 2018-12-04 DIAGNOSIS — F419 Anxiety disorder, unspecified: Secondary | ICD-10-CM

## 2018-12-04 DIAGNOSIS — L98491 Non-pressure chronic ulcer of skin of other sites limited to breakdown of skin: Secondary | ICD-10-CM

## 2018-12-04 DIAGNOSIS — Z79899 Other long term (current) drug therapy: Secondary | ICD-10-CM

## 2018-12-04 DIAGNOSIS — R634 Abnormal weight loss: Secondary | ICD-10-CM

## 2018-12-04 DIAGNOSIS — Z8619 Personal history of other infectious and parasitic diseases: Secondary | ICD-10-CM

## 2018-12-04 DIAGNOSIS — F3130 Bipolar disorder, current episode depressed, mild or moderate severity, unspecified: Secondary | ICD-10-CM

## 2018-12-04 DIAGNOSIS — Z598 Other problems related to housing and economic circumstances: Secondary | ICD-10-CM

## 2018-12-04 DIAGNOSIS — F200 Paranoid schizophrenia: Secondary | ICD-10-CM

## 2018-12-04 DIAGNOSIS — F329 Major depressive disorder, single episode, unspecified: Secondary | ICD-10-CM

## 2018-12-04 DIAGNOSIS — Z6821 Body mass index (BMI) 21.0-21.9, adult: Secondary | ICD-10-CM

## 2018-12-04 DIAGNOSIS — M2550 Pain in unspecified joint: Secondary | ICD-10-CM

## 2018-12-04 MED ORDER — BUPROPION HCL ER (SR) 100 MG PO TB12: 100.0000 mg | ORAL_TABLET | Freq: Two times a day (BID) | ORAL | 1 refills | Status: DC

## 2018-12-04 MED ORDER — HYDROXYZINE HCL 25 MG PO TABS: 25.0000 mg | ORAL_TABLET | Freq: Every day | ORAL | 1 refills | Status: DC | PRN

## 2018-12-04 MED FILL — buPROPion HCL ER (SR) 100 M: 100 | 30 days supply | Qty: 60 | Fill #0

## 2018-12-04 MED FILL — hydrOXYzine HCL 25 MG TABS: 25 | 30 days supply | Qty: 30 | Fill #0

## 2018-12-04 NOTE — Patient Instructions (Addendum)
Ms. Mcelmurry,  It was nice meeting you today! I have sent in two prescriptions to the [facility]. I am hopeful that in restarting these, we can get your anxiety and depression under better control. Please follow-up with me in 1 month.

## 2018-12-09 NOTE — Progress Notes (Signed)
Internal Medicine Clinic Attending  I saw and evaluated the patient.  I personally confirmed the key portions of the history and exam documented by Dr. Jones and I reviewed pertinent patient test results.  The assessment, diagnosis, and plan were formulated together and I agree with the documentation in the resident's note.     

## 2018-12-09 NOTE — Assessment & Plan Note (Signed)
Pt has a history of Hepatitis C which was treated. Hep C antibody on 03/19/2016 was positive with no Hep C RNA detected on 03/27/2016. Today, pt worried about her Hepatitis C causing weight loss and abdominal pain. Discussed with pt that lab results from 2018 indicate that her Hep C had been successfully cleared and any new symptoms would come from a reinfection. Pt denied any blood exposures, IVDU, or other high risk behaviors. Per chart review, pt's weight is 2kg up from 04/2018  Plan - continue to monitor

## 2018-12-09 NOTE — Progress Notes (Signed)
   CC: anxiety/depression  HPI:  Ms.Diamantina Fahrney is a 48 y.o. F with significant PMH as outlined below who presents today for follow-up of her anxiety and depression. Please see problem-based charting for additional information.  Past Medical History:  Diagnosis Date  . Anxiety and depression   . History of hepatitis C    Review of Systems:   Review of Systems  Constitutional: Positive for weight loss. Negative for chills and fever.  Respiratory: Negative for shortness of breath.   Cardiovascular: Negative for chest pain.  Gastrointestinal: Negative for abdominal pain, nausea and vomiting.  Musculoskeletal: Positive for joint pain.  Skin: Positive for itching and rash.  Psychiatric/Behavioral: Positive for depression. Negative for substance abuse and suicidal ideas. The patient is nervous/anxious.    Physical Exam:  Vitals:   12/04/18 1540  BP: 124/85  Pulse: 70  Temp: 98.9 F (37.2 C)  TempSrc: Oral  SpO2: 99%  Weight: 119 lb 14.4 oz (54.4 kg)  Height: 5\' 3"  (1.6 m)   Physical Exam Vitals signs and nursing note reviewed.  Constitutional:      General: She is not in acute distress.    Appearance: She is normal weight. She is not ill-appearing.  Cardiovascular:     Rate and Rhythm: Normal rate and regular rhythm.     Heart sounds: Normal heart sounds.  Pulmonary:     Effort: Pulmonary effort is normal. No respiratory distress.     Breath sounds: Normal breath sounds. No wheezing, rhonchi or rales.  Abdominal:     General: Abdomen is flat.     Palpations: Abdomen is soft.  Skin:    Comments: Multiple areas of small ulceration and skin breakdown along arms, legs, and trunk. Pt actively picking at these areas during the examination.  Neurological:     Mental Status: She is alert.  Psychiatric:        Attention and Perception: She is inattentive.        Mood and Affect: Mood is anxious. Affect is tearful.        Speech: Speech normal.        Behavior: Behavior normal.         Thought Content: Thought content does not include homicidal or suicidal ideation.        Cognition and Memory: Cognition normal.    Assessment & Plan:   See Encounters Tab for problem based charting.  Patient seen with Dr. Evette Doffing
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
## 2018-12-10 ENCOUNTER — Telehealth: Payer: Self-pay

## 2018-12-10 NOTE — Telephone Encounter (Signed)
Pt wants something called in for head lice

## 2018-12-10 NOTE — Telephone Encounter (Signed)
Requesting to speak with a nurse about head lice, please call pt back.

## 2018-12-11 ENCOUNTER — Telehealth: Payer: Self-pay | Admitting: Licensed Clinical Social Worker

## 2018-12-11 MED ORDER — IVERMECTIN 0.5 % EX LOTN: 1.0000 | TOPICAL_LOTION | Freq: Once | CUTANEOUS | 2 refills | Status: AC

## 2018-12-11 MED ORDER — IVERMECTIN 0.5 % EX LOTN: 1.0000 | TOPICAL_LOTION | Freq: Once | CUTANEOUS | 2 refills | Status: DC

## 2018-12-11 NOTE — Addendum Note (Signed)
Addended by: Ladona Horns on: 12/11/2018 03:53 PM   Modules accepted: Orders

## 2018-12-11 NOTE — Telephone Encounter (Signed)
Pt requesting prescription for head lice. Talked with pt over the telephone who states she got lice from her grandchildren. She has been staying with her daughter and grandchildren, and believes the kids picked up lice at their dad's house. She has had lice once before and states it feels exactly the same.   Prescription for Ivermectin sent into the [facility].

## 2018-12-11 NOTE — Telephone Encounter (Signed)
[facility] only has spinosad for lice, if medicaid will not pay it will be appr $100.00, can we see what medicaid prefers? And send to [facility].

## 2018-12-11 NOTE — Telephone Encounter (Signed)
Patient was called to discuss referral. Patient was provided two options to call for psychiatry that accept the uninsured. Patient will be added to my schedule for 11/11 @ 1:00 (1 hour in person visit).

## 2018-12-11 NOTE — Addendum Note (Signed)
Addended by: Ladona Horns on: 12/11/2018 04:35 PM   Modules accepted: Orders

## 2018-12-18 ENCOUNTER — Telehealth: Payer: Self-pay

## 2018-12-18 NOTE — Telephone Encounter (Signed)
Called pt - Stated Wellbutrin is causing abd cramping and pain; nausea. And when she stopped taking  it for 2 days, her symptoms went away But she has decreased smoking. Please advise. Thanks

## 2018-12-18 NOTE — Telephone Encounter (Signed)
Requesting to speak with a nurse about   buPROPion (WELLBUTRIN SR) 100 MG 12 hr tablet   Please call pt back.

## 2018-12-19 NOTE — Telephone Encounter (Signed)
I am going to defer to her PCP Dr. Ronnald Ramp or she could elect to have a telehealth appointment with an [facility] physician next week to discuss trying a different antidepressant.

## 2018-12-24 ENCOUNTER — Encounter: Payer: Self-pay | Admitting: Internal Medicine

## 2018-12-24 ENCOUNTER — Ambulatory Visit: Payer: Medicaid Other | Admitting: Licensed Clinical Social Worker

## 2018-12-24 ENCOUNTER — Telehealth: Payer: Self-pay | Admitting: *Deleted

## 2018-12-24 ENCOUNTER — Ambulatory Visit (INDEPENDENT_AMBULATORY_CARE_PROVIDER_SITE_OTHER): Payer: Self-pay | Admitting: Internal Medicine

## 2018-12-24 VITALS — BP 135/92 | HR 61 | Temp 98.1°F | Ht 63.0 in | Wt 123.6 lb

## 2018-12-24 DIAGNOSIS — Z79899 Other long term (current) drug therapy: Secondary | ICD-10-CM

## 2018-12-24 DIAGNOSIS — F32A Depression, unspecified: Secondary | ICD-10-CM

## 2018-12-24 DIAGNOSIS — Z91410 Personal history of adult physical and sexual abuse: Secondary | ICD-10-CM

## 2018-12-24 DIAGNOSIS — F4310 Post-traumatic stress disorder, unspecified: Secondary | ICD-10-CM

## 2018-12-24 DIAGNOSIS — F329 Major depressive disorder, single episode, unspecified: Secondary | ICD-10-CM

## 2018-12-24 DIAGNOSIS — F419 Anxiety disorder, unspecified: Secondary | ICD-10-CM

## 2018-12-24 DIAGNOSIS — G4700 Insomnia, unspecified: Secondary | ICD-10-CM

## 2018-12-24 MED ORDER — CLONAZEPAM 0.5 MG PO TABS: 0.5000 mg | ORAL_TABLET | Freq: Two times a day (BID) | ORAL | 0 refills | Status: DC | PRN

## 2018-12-24 MED FILL — clonazePAM 0.5 MG TABS: 0.5 | 14 days supply | Qty: 28 | Fill #0

## 2018-12-24 NOTE — Progress Notes (Signed)
   CC: Anxiety   HPI:  [name] is a 48 y.o. year-old female with PMH listed below who presents to clinic for anxiety. Please see problem based assessment and plan for further details.   Past Medical History:  Diagnosis Date  . Anxiety and depression   . History of hepatitis C    Review of Systems:   Review of Systems  Constitutional: Negative for malaise/fatigue and weight loss.  Psychiatric/Behavioral: Positive for depression. Negative for hallucinations and suicidal ideas. The patient is nervous/anxious and has insomnia.     Physical Exam:  Vitals:   12/24/18 1530  BP: (!) 135/92  Pulse: 61  Temp: 98.1 F (36.7 C)  TempSrc: Oral  SpO2: 91%  Weight: 123 lb 9.6 oz (56.1 kg)  Height: 5\' 3"  (1.6 m)    General: tearful, fidgety, in no acute distress  Psych: Patient describes mood as bad. Affect is labile. Appears attentive and has appropriate behavior. Speech is normal. Denies VH, AH, SI, Hi. Judgement and insight are good.        Office Visit from 12/24/2018 in [place]  PHQ-9 Total Score  24       Assessment & Plan:   See Encounters Tab for problem based charting.  Patient discussed with Dr. Daryll Drown
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
## 2018-12-24 NOTE — Patient Instructions (Addendum)
Ms. Sutter,   For your anxiety, we will go down on your dose of Wellbutrin. Please start taking 1 tablet once a day instead of the two you are taking.   I also sent a prescription for klonopin that you can take twice a day as needed for the next 2 weeks. This is only short term and will not be refilled. You will need to contact [facility] and re-establish with them for further management.   Make sure to keep up you appointment with Miquel Dunn on 11/24.   Call us if you have any questions or concerns.   - Dr. Frederico Hamman
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
## 2018-12-24 NOTE — Telephone Encounter (Signed)
Pt is currently being seen in [facility] today.

## 2018-12-25 ENCOUNTER — Encounter: Payer: Self-pay | Admitting: Internal Medicine

## 2018-12-26 NOTE — Progress Notes (Signed)
Internal Medicine Clinic Attending  Case discussed with Dr. Santos-Sanchez at the time of the visit.  We reviewed the resident's history and exam and pertinent patient test results.  I agree with the assessment, diagnosis, and plan of care documented in the resident's note.    

## 2019-01-06 ENCOUNTER — Ambulatory Visit: Payer: Medicaid Other | Admitting: Licensed Clinical Social Worker

## 2019-01-06 ENCOUNTER — Telehealth: Payer: Self-pay | Admitting: Licensed Clinical Social Worker

## 2019-01-06 NOTE — Telephone Encounter (Signed)
Patient was called for her scheduled appointment. Patient reported that she hurt her foot, and needed to reschedule. Patient will be added to my schedule for 12/15 @ 11:30.

## 2019-01-27 ENCOUNTER — Ambulatory Visit: Payer: Medicaid Other | Admitting: Licensed Clinical Social Worker

## 2019-01-29 ENCOUNTER — Ambulatory Visit: Payer: Medicaid Other | Admitting: Licensed Clinical Social Worker

## 2019-02-17 NOTE — Addendum Note (Signed)
Addended by: Neomia Dear on: 02/17/2019 03:49 PM   Modules accepted: Orders

## 2019-06-03 ENCOUNTER — Emergency Department (HOSPITAL_BASED_OUTPATIENT_CLINIC_OR_DEPARTMENT_OTHER)
Admission: EM | Admit: 2019-06-03 | Discharge: 2019-06-03 | Disposition: A | Discharge status: Home / Self Care | Payer: Medicaid Other | Attending: Emergency Medicine | Admitting: Emergency Medicine

## 2019-06-03 ENCOUNTER — Other Ambulatory Visit: Payer: Self-pay

## 2019-06-03 ENCOUNTER — Encounter (HOSPITAL_BASED_OUTPATIENT_CLINIC_OR_DEPARTMENT_OTHER): Payer: Self-pay

## 2019-06-03 ENCOUNTER — Encounter: Payer: Self-pay | Admitting: *Deleted

## 2019-06-03 DIAGNOSIS — Z79899 Other long term (current) drug therapy: Secondary | ICD-10-CM | POA: Insufficient documentation

## 2019-06-03 DIAGNOSIS — F17210 Nicotine dependence, cigarettes, uncomplicated: Secondary | ICD-10-CM | POA: Insufficient documentation

## 2019-06-03 DIAGNOSIS — H9201 Otalgia, right ear: Secondary | ICD-10-CM

## 2019-06-03 MED ORDER — AMOXICILLIN-POT CLAVULANATE 875-125 MG PO TABS: 1.0000 | ORAL_TABLET | Freq: Two times a day (BID) | ORAL | 0 refills | Status: DC

## 2019-06-03 MED ORDER — IBUPROFEN 400 MG PO TABS
600.0000 mg | ORAL_TABLET | Freq: Once | ORAL | Status: AC
  Administered 2019-06-03: 600 mg via ORAL
  Filled 2019-06-03: qty 1

## 2019-06-03 NOTE — ED Triage Notes (Signed)
Pt c/o right earache x 1 year and scattered sores-NAD-steady gait

## 2019-06-03 NOTE — ED Provider Notes (Signed)
[facility] EMERGENCY DEPARTMENT Provider Note   CSN: [id number] Arrival date & time: 06/03/19  1609     History Chief Complaint  Patient presents with  . Otalgia    [name] is a 49 y.o. female.  Patient presents with recurrent right ear pain, ongoing for a year. She states that she feels like something is in her ear. She has been evaluated four times in the last 12 months. She has been on several antibiotics for otitis. She has not followed up with ENT. She also has a rash involving her legs and abdomen. She has been treated for scabies and bed bugs. She has a dog at home that gets bathed frequently. She states her living areas have been inspected for bed bugs (none found).  The history is provided by the patient and medical records.  Otalgia Location:  Right Quality:  Sore and throbbing Severity:  Moderate Duration:  12 months Timing:  Intermittent Progression:  Waxing and waning Chronicity:  Chronic Context: not foreign body in ear and not water in ear   Associated symptoms: rash   Associated symptoms: no ear discharge and no hearing loss        Past Medical History:  Diagnosis Date  . Anxiety and depression   . History of hepatitis C     Patient Active Problem List   Diagnosis Date Noted  . High risk sexual behavior 01/29/2017  . Vaginal odor 08/20/2016  . Tobacco abuse 03/27/2016  . Encounter for screening mammogram for breast cancer 03/27/2016  . STD (female) 03/19/2016  . Anxiety and depression 03/18/2016  . History of hepatitis C     Past Surgical History:  Procedure Laterality Date  . AUGMENTATION MAMMAPLASTY Bilateral   . BREAST ENHANCEMENT SURGERY    . CESAREAN SECTION    . MYRINGOTOMY       OB History    Gravida  1   Para      Term      Preterm      AB      Living        SAB      TAB      Ectopic      Multiple      Live Births              Family History  Problem Relation Age of Onset  . Cancer Other   .  Ovarian cancer Maternal Aunt        deceased    Social History   Tobacco Use  . Smoking status: Current Every Day Smoker    Packs/day: 0.50    Years: 30.00    Pack years: 15.00    Types: Cigarettes  . Smokeless tobacco: Never Used  Substance Use Topics  . Alcohol use: No  . Drug use: Yes    Types: Marijuana    Home Medications Prior to Admission medications   Medication Sig Start Date End Date Taking? Authorizing Provider  buPROPion (WELLBUTRIN SR) 100 MG 12 hr tablet Take 1 tablet (100 mg total) by mouth 2 (two) times daily. 12/04/18 02/02/19  Thom Chimes, MD  ciprofloxacin-dexamethasone (CIPRODEX) OTIC suspension Place 4 drops into the right ear 2 (two) times daily. 04/24/18   Palumbo, April, MD  clonazePAM (KLONOPIN) 0.5 MG tablet Take 1 tablet (0.5 mg total) by mouth 2 (two) times daily as needed for anxiety. 12/24/18 12/24/19  Burna Cash, MD  hydrOXYzine (ATARAX/VISTARIL) 25 MG tablet Take 1 tablet (25 mg  total) by mouth daily as needed. 12/04/18   Ladona Horns, MD  predniSONE (DELTASONE) 10 MG tablet Take 2 tablets (20 mg total) by mouth 2 (two) times daily with a meal. 02/19/18   Veryl Speak, MD    Allergies    Tylenol [acetaminophen]  Review of Systems   Review of Systems  HENT: Positive for ear pain. Negative for ear discharge and hearing loss.   Skin: Positive for rash.  All other systems reviewed and are negative.   Physical Exam Updated Vital Signs BP (!) 144/111 (BP Location: Left Arm)   Pulse 63   Temp 97.6 F (36.4 C) (Oral)   Resp 14   Ht 5\' 2"  (1.575 m)   Wt 54.4 kg   SpO2 100%   BMI 21.95 kg/m   Physical Exam Constitutional:      Appearance: Normal appearance.  HENT:     Right Ear: Tympanic membrane normal. Tenderness present. Tympanic membrane is not injected or erythematous.     Left Ear: Tympanic membrane normal.     Ears:     Comments: Minimal redness with tenderness to auricle and helix of right ear. No abscess or  fluctuance. Mild tenderness without redness behind the ear.    Mouth/Throat:     Mouth: Mucous membranes are moist.     Pharynx: Oropharynx is clear. No posterior oropharyngeal erythema.  Eyes:     Conjunctiva/sclera: Conjunctivae normal.  Cardiovascular:     Rate and Rhythm: Normal rate and regular rhythm.  Pulmonary:     Effort: Pulmonary effort is normal.     Breath sounds: Normal breath sounds.  Abdominal:     Palpations: Abdomen is soft.  Musculoskeletal:        General: Normal range of motion.     Cervical back: Normal range of motion.  Skin:    Findings: Rash present.  Neurological:     Mental Status: She is alert and oriented to person, place, and time.  Psychiatric:        Mood and Affect: Mood normal.     ED Results / Procedures / Treatments   Labs (all labs ordered are listed, but only abnormal results are displayed) Labs Reviewed - No data to display  EKG None  Radiology No results found.  Procedures Procedures (including critical care time)  Medications Ordered in ED Medications  ibuprofen (ADVIL) tablet 600 mg (600 mg Oral Given 06/03/19 1929)    ED Course  I have reviewed the triage vital signs and the nursing notes.  Pertinent labs & imaging results that were available during my care of the patient were reviewed by me and considered in my medical decision making (see chart for details).    MDM Rules/Calculators/A&P                     Patient with nonspecific eruption. No signs of infection. Discharge with symptomatic treatment.    Pt presenting with otalgia Pt afebrile in NAD. Exam not concerning for mastoiditis, or malignant OE. Possible cellulitis Discharge with augmentin.  Advised follow up with ENT.  Return precautions discussed. Pt appears safe for discharge. Final Clinical Impression(s) / ED Diagnoses Final diagnoses:  Otalgia of right ear    Rx / DC Orders ED Discharge Orders         Ordered    amoxicillin-clavulanate (AUGMENTIN)  875-125 MG tablet  2 times daily     06/03/19 2057  Felicie Morn, NP 06/03/19 2321    Arby Barrette, MD 06/08/19 [phone]

## 2019-06-03 NOTE — Care Management (Signed)
  MATCH Medication Assistance Card Name: Casara Perrier (MRN): [id number] Bin: [id number] RX Group: [id number] Discharge Date: 06/03/2019 Expiration Date:06/14/2019                                           (must be filled within 7 days of discharge)  Dear  [name]  :   You have been approved to have the prescriptions written by your discharging physician filled through our [facility] (Medication Assistance Through [facility]) program. This program allows for a one-time (no refills) 34-day supply of selected medications for a low copay amount.  The copay is $3.00 per prescription. For instance, if you have one prescription, you will pay $3.00; for two prescriptions, you pay $6.00; for three prescriptions, you pay $9.00; and so on.  Only certain pharmacies are participating in this program with [facility]. You will need to select one of the pharmacies from the attached list and take your prescriptions, this letter, and your photo ID to one of the participating pharmacies.   We are excited that you are able to use the [facility] program to get your medications. These prescriptions must be filled within 7 days of hospital discharge or they will no longer be valid for the [facility] program. Should you have any problems with your prescriptions please contact your case management team member at [phone] for [place]/ [facility].  Thank you, [facility] Care Management

## 2019-06-03 NOTE — Care Management (Addendum)
ED CM received call from Felicie Morn NP at [facility] concerning medication assistance issue. Patient is uninsured and does not have money to pay for discharged medications.  Patient is eligible for Medication Assistance with MATCH. Patient enrolled in program.  At patient's request prescrpition and MATCH letter be  Sent to [facility] [phone] where patient will pick up tomorrow.

## 2019-06-03 NOTE — Discharge Instructions (Signed)
Pick up your prescription in the morning. Follow-up with ENT as discussed.

## 2019-06-04 MED FILL — AMOX-CLAV 875-125 MG TABLET: 875-125 | 7 days supply | Qty: 14 | Fill #0

## 2019-06-22 ENCOUNTER — Emergency Department (HOSPITAL_BASED_OUTPATIENT_CLINIC_OR_DEPARTMENT_OTHER)
Admission: EM | Admit: 2019-06-22 | Discharge: 2019-06-22 | Disposition: A | Discharge status: Home / Self Care | Payer: Self-pay | Attending: Emergency Medicine | Admitting: Emergency Medicine

## 2019-06-22 ENCOUNTER — Other Ambulatory Visit: Payer: Self-pay

## 2019-06-22 ENCOUNTER — Encounter (HOSPITAL_BASED_OUTPATIENT_CLINIC_OR_DEPARTMENT_OTHER): Payer: Self-pay

## 2019-06-22 DIAGNOSIS — F17210 Nicotine dependence, cigarettes, uncomplicated: Secondary | ICD-10-CM | POA: Insufficient documentation

## 2019-06-22 DIAGNOSIS — W57XXXA Bitten or stung by nonvenomous insect and other nonvenomous arthropods, initial encounter: Secondary | ICD-10-CM | POA: Insufficient documentation

## 2019-06-22 DIAGNOSIS — R21 Rash and other nonspecific skin eruption: Secondary | ICD-10-CM | POA: Insufficient documentation

## 2019-06-22 MED ORDER — HYDROXYZINE HCL 25 MG PO TABS: 25.0000 mg | ORAL_TABLET | Freq: Four times a day (QID) | ORAL | 0 refills | Status: DC | PRN

## 2019-06-22 MED ORDER — CEPHALEXIN 500 MG PO CAPS: 500.0000 mg | ORAL_CAPSULE | Freq: Three times a day (TID) | ORAL | 0 refills | Status: AC
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
## 2019-06-22 NOTE — Discharge Instructions (Addendum)
Recommend schedule follow-up appointment with a primary care doctor as well as ear nose and throat.  In your discharge instructions, we have listed numbers for both primary care as well as ENT.  To make an appointment, you need to call their clinic.  If you develop worsening redness, pain, fever or other new concerning symptom recommend return to ER for recheck.  Although you reported having exterminator evaluate earlier this spring, I would recommend having this reevaluated due to concern for possibility of bedbugs.

## 2019-06-22 NOTE — ED Provider Notes (Signed)
[facility] EMERGENCY DEPARTMENT Provider Note   CSN: [id number] Arrival date & time: 06/22/19  1514     History Chief Complaint  Patient presents with  . Rash    [name] is a 49 y.o. female.  Presents to ER with concern for rash.  Patient states for the last couple months she has been having intermittent rash which she suspects her bug bites over her arms and legs.  States that it seems to come and go but is generally worse whenever she is standing at her camper.  States that she went to another hospital and was told that she has bedbugs so she had this evaluated by an exterminator and reports they did not find any bedbugs.  States that she has not had any significant change in her symptoms, specifically the number of lesions as well as the severity of them has not changed.  They are somewhat itchy, no real pain, no generalized redness, no fever.  She has had any worsening of her right ear symptoms.  Rash does not involve mucosal membranes, spares genitals.  Obtained additional history from recent ER visits including [facility], [place] [facility].  Patient has recurrent right otitis media and has been prescribed Augmentin course x2 over the past couple months and has been referred to ENT but has not followed up.  Additionally for her rash patient was prescribed a course of prednisone and Atarax.  Patient does not remember taking these medications.  She does report taking the antibiotics however.  She confirms that she has not followed up with a PCP or ENT.   Denies any chronic medical problems. Per chart review Hep C, anxiety.   HPI     Past Medical History:  Diagnosis Date  . Anxiety and depression   . History of hepatitis C     Patient Active Problem List   Diagnosis Date Noted  . High risk sexual behavior 01/29/2017  . Vaginal odor 08/20/2016  . Tobacco abuse 03/27/2016  . Encounter for screening mammogram for breast cancer 03/27/2016  . STD (female) 03/19/2016  .  Anxiety and depression 03/18/2016  . History of hepatitis C     Past Surgical History:  Procedure Laterality Date  . AUGMENTATION MAMMAPLASTY Bilateral   . BREAST ENHANCEMENT SURGERY    . CESAREAN SECTION    . MYRINGOTOMY       OB History    Gravida  1   Para      Term      Preterm      AB      Living        SAB      TAB      Ectopic      Multiple      Live Births              Family History  Problem Relation Age of Onset  . Cancer Other   . Ovarian cancer Maternal Aunt        deceased    Social History   Tobacco Use  . Smoking status: Current Every Day Smoker    Packs/day: 0.50    Years: 30.00    Pack years: 15.00    Types: Cigarettes  . Smokeless tobacco: Never Used  Substance Use Topics  . Alcohol use: No  . Drug use: Yes    Types: Marijuana    Home Medications Prior to Admission medications   Medication Sig Start Date End Date Taking? Authorizing  Provider  amoxicillin-clavulanate (AUGMENTIN) 875-125 MG tablet Take 1 tablet by mouth 2 (two) times daily. One po bid x 7 days 06/03/19   Etta Quill, NP  cephALEXin (KEFLEX) 500 MG capsule Take 1 capsule (500 mg total) by mouth 3 (three) times daily for 7 days. 06/22/19 06/29/19  Lucrezia Starch, MD  hydrOXYzine (ATARAX/VISTARIL) 25 MG tablet Take 1 tablet (25 mg total) by mouth every 6 (six) hours as needed for itching. 06/22/19   Lucrezia Starch, MD    Allergies    Tylenol [acetaminophen]  Review of Systems   Review of Systems  Constitutional: Negative for chills and fever.  HENT: Negative for ear pain and sore throat.   Eyes: Negative for pain and visual disturbance.  Respiratory: Negative for cough and shortness of breath.   Cardiovascular: Negative for chest pain and palpitations.  Gastrointestinal: Negative for abdominal pain and vomiting.  Genitourinary: Negative for dysuria and hematuria.  Musculoskeletal: Negative for arthralgias and back pain.  Skin: Positive for color  change and rash.  Neurological: Negative for seizures and syncope.  All other systems reviewed and are negative.   Physical Exam Updated Vital Signs BP 121/78 (BP Location: Right Arm)   Pulse 78   Temp 98.4 F (36.9 C) (Oral)   Resp 20   Ht 5\' 2"  (1.575 m)   Wt 53.5 kg   SpO2 99%   BMI 21.58 kg/m   Physical Exam Vitals and nursing note reviewed.  Constitutional:      General: She is not in acute distress.    Appearance: She is well-developed.  HENT:     Head: Normocephalic and atraumatic.     Right Ear: A middle ear effusion is present.     Ears:     Comments: L TM is clear, R TM has white effusion noted    Nose: Nose normal. No congestion.  Eyes:     Conjunctiva/sclera: Conjunctivae normal.  Cardiovascular:     Rate and Rhythm: Normal rate and regular rhythm.     Heart sounds: No murmur.  Pulmonary:     Effort: Pulmonary effort is normal. No respiratory distress.     Breath sounds: Normal breath sounds.  Abdominal:     Palpations: Abdomen is soft.     Tenderness: There is no abdominal tenderness.  Musculoskeletal:     Cervical back: Neck supple.  Skin:    General: Skin is warm and dry.     Comments: Scattered lesions over arms and legs include both extensor and flexor surfaces, occasional lesion on chest and back.  Most lesions are approximately 0.5 cm in diameter, red, blanchable, not raised, some associated excoriations, no generalized erythema or induration is appreciated  Neurological:     General: No focal deficit present.     Mental Status: She is alert and oriented to person, place, and time.     ED Results / Procedures / Treatments   Labs (all labs ordered are listed, but only abnormal results are displayed) Labs Reviewed - No data to display  EKG None  Radiology No results found.  Procedures Procedures (including critical care time)  Medications Ordered in ED Medications - No data to display  ED Course  I have reviewed the triage vital  signs and the nursing notes.  Pertinent labs & imaging results that were available during my care of the patient were reviewed by me and considered in my medical decision making (see chart for details).    MDM Rules/Calculators/A&P  49 year old lady presented to ER with chief complaint of rash.  On exam patient has occasional scattered lesions over her arms and legs as well as chest and back.  Appearance raises suspicion for bug bites, possibly bedbugs.  Patient was adamant that she does not have bedbugs and states she has had an exterminator come to her camper to inspect.  There is no evidence for current superinfection, generalized cellulitis.  Chart review noted recurrent otitis media for which patient has been given antibiotics x2 and ENT referral.  Patient has not followed up with ENT but states that her ear is no longer bothering her.  I did note middle ear effusion still persistent in right ear.  Strongly recommended that she follow-up with ENT as well as a primary care doctor.  Given patient's concern about possible skin infection, will give Rx for cephalexin.  For her itchiness, provided Atarax Rx.  Currently patient is well-appearing with normal vital signs, believe she is appropriate for discharge and outpatient management at this time.     After the discussed management above, the patient was determined to be safe for discharge.  The patient was in agreement with this plan and all questions regarding their care were answered.  ED return precautions were discussed and the patient will return to the ED with any significant worsening of condition.  Final Clinical Impression(s) / ED Diagnoses Final diagnoses:  Bug bite, initial encounter    Rx / DC Orders ED Discharge Orders         Ordered    hydrOXYzine (ATARAX/VISTARIL) 25 MG tablet  Every 6 hours PRN     06/22/19 1617    cephALEXin (KEFLEX) 500 MG capsule  3 times daily     06/22/19 1617             Milagros Loll, MD 06/22/19 1656

## 2019-08-18 ENCOUNTER — Telehealth: Payer: Self-pay | Admitting: Student

## 2019-08-18 DIAGNOSIS — F32A Depression, unspecified: Secondary | ICD-10-CM

## 2019-08-18 NOTE — Telephone Encounter (Signed)
Patient reporting feeling really down and depressed and is requesting a call back

## 2019-08-18 NOTE — Telephone Encounter (Signed)
Ph# [phone] barbara, pt's mother, pt states she forgets things all the time and must be reminded numerous times. She states she really would like to see [facility]. And would like ashton to schedule an appt for her and call the appt to her mother who will make sure she comes to appt

## 2019-08-18 NOTE — Telephone Encounter (Signed)
Rtc, got vmail, lm for rtc 

## 2019-08-19 NOTE — Telephone Encounter (Signed)
Appears that patient last saw Phineas Semen in November 2020. I believe she should be able to schedule a visit with Phineas Semen; however, if a referral is needed, I can place that for her as well.

## 2019-08-20 ENCOUNTER — Encounter: Payer: Medicaid Other | Admitting: Internal Medicine

## 2019-08-20 NOTE — Telephone Encounter (Signed)
Please place a referral

## 2019-08-20 NOTE — Telephone Encounter (Signed)
Done, thank you!

## 2019-08-21 ENCOUNTER — Other Ambulatory Visit: Payer: Self-pay

## 2019-08-21 ENCOUNTER — Encounter: Payer: Self-pay | Admitting: Internal Medicine

## 2019-08-21 ENCOUNTER — Ambulatory Visit (INDEPENDENT_AMBULATORY_CARE_PROVIDER_SITE_OTHER): Payer: Self-pay | Admitting: Internal Medicine

## 2019-08-21 VITALS — BP 115/92 | HR 66 | Temp 98.1°F | Ht 62.0 in | Wt 117.9 lb

## 2019-08-21 DIAGNOSIS — H6690 Otitis media, unspecified, unspecified ear: Secondary | ICD-10-CM | POA: Insufficient documentation

## 2019-08-21 DIAGNOSIS — H7291 Unspecified perforation of tympanic membrane, right ear: Secondary | ICD-10-CM

## 2019-08-21 DIAGNOSIS — T148XXD Other injury of unspecified body region, subsequent encounter: Secondary | ICD-10-CM

## 2019-08-21 DIAGNOSIS — L989 Disorder of the skin and subcutaneous tissue, unspecified: Secondary | ICD-10-CM | POA: Insufficient documentation

## 2019-08-21 DIAGNOSIS — W57XXXD Bitten or stung by nonvenomous insect and other nonvenomous arthropods, subsequent encounter: Secondary | ICD-10-CM

## 2019-08-21 NOTE — Progress Notes (Signed)
   CC: ear pain and malodor  HPI:  [name] is a 49 y.o. female with PMHx as listed below presenting for persistent right ear pain and malodor. She notes decreased hearing in that ear but denies any dizziness of balance issues. She is also expressing concerns about generalized rash that has been present for several months but has failed to resolve..  Please see problem based charting for complete assessment and plan.  Past Medical History:  Diagnosis Date  . Anxiety and depression   . History of hepatitis C    Review of Systems:  Negative except as stated in HPI.  Physical Exam:  Vitals:   08/21/19 1327  BP: (!) 115/92  Pulse: 66  Temp: 98.1 F (36.7 C)  TempSrc: Oral  SpO2: 96%  Weight: 117 lb 14.4 oz (53.5 kg)  Height: 5\' 2"  (1.575 m)   Physical Exam  Constitutional: Appears well-developed and well-nourished. No distress.  HENT: Normocephalic and atraumatic, EOMI, conjunctiva normal, moist mucous membranes L ear: external ear without lesions, tenderness or masses noted; auditory canal wnl; TM membrane intact  R ear: external ear without lesions, tenderness or masses noted; auditory canal without significant erythema or cerumen noted; TM membrane with minimal perforation noted at 7 o'clock Cardiovascular: Normal rate, regular rhythm, S1 and S2 present, no murmurs, rubs, gallops.  Distal pulses intact Respiratory: No respiratory distress, no accessory muscle use.  Effort is normal.  Lungs are clear to auscultation bilaterally. GI: Nondistended, soft, nontender to palpation, normal active bowel sounds Musculoskeletal: Normal bulk and tone.  No peripheral edema noted. Neurological: Is alert and oriented x4, no apparent focal deficits noted. Skin: Warm and dry.  Diffuse lesions noted on bilateral upper extremities, lower extremities, abdomen, back and scalp that are nonpruritic and nontender; no signs of surrounding infection.  Psychiatric: Normal mood and affect. Behavior is  normal. Judgment and thought content normal.    Assessment & Plan:   See Encounters Tab for problem based charting.  Patient seen with Dr. 

## 2019-08-21 NOTE — Assessment & Plan Note (Signed)
Patient endorses history of right ear infections for which she has been evaluated multiple times. She was initially prescribed antibiotic ear drops but notes that she subsequently developed swimmers ear and has since been struggling with ongoing right ear infections. She was most recently evaluated for this and prescribed Augmentin 875-125 for seven days duration in April. She notes that she has ongoing right ear pain and now has malodor. She denies any active drainage at this time. She does note trying to keep it clean with Qtips to try to remove the wax build up. Also endorses decreased hearing in the right ear. On examination, no tenderness to palpation of the pinna or any significant external ear or auditory canal findings. Does have a small perforation at the 7 o'clock position of tympanic membrane.  Although she could be symptomatically managed for four weeks to allow for healing, patient has had recurrent otitis media and with new perforation, she would benefit from ENT evaluation.  Plan: Referral to ENT

## 2019-08-21 NOTE — Patient Instructions (Addendum)
Ms. Welp,  It was a pleasure seeing you in clinic. Today we discussed:   Ear pain and drainage: I will send a referral to ENT specialist. Please keep the ear clean and dry. Please avoid inserting anything into the ear that may cause further damage.  Skin lesions: I will send referral to the dermatologist for this. Please let us know if this gets any worse. I am getting some lab tests and will call you with any abnormalities.   If you have any questions or concerns, please call our clinic at [phone] between 9am-5pm and after hours call [phone] and ask for the internal medicine resident on call. If you feel you are having a medical emergency please call 911.   Thank you, we look forward to helping you remain healthy!  To schedule an appointment for a COVID vaccine or be added to the vaccine wait list: Go to [email or web address]   OR Go to [email or web address]                  OR Call [phone]                                     OR Call [phone] and select Option 2

## 2019-08-21 NOTE — Assessment & Plan Note (Signed)
Patient has been evaluated for this several times over the past year. She notes initially noting skin lesions when she moved into her camper and thought it was secondary to bed bugs but persistent despite appropriate treatment. She notes that they have been progressively worsening. Denies any pruritis or tenderness. She is currently living with her mother and has a dog but she denies anyone around her with similar symptoms and notes that she keeps the dog clean. She has been evaluated multiple times for this in the emergency department and recommended for symptomatic treatment; however, on the last ED visit, patient given course of Keflex which she notes that resulted in slight improvement of her symptoms  She denies any systemic signs at this time.  On examination, multiple lesions noted on bilateral upper and lower extremities, abdomen, back and in scalp. No signs of surrounding erythema, edema, tenderness or warmth. No acute need for antibiotics currently. Unclear etiology of patient's symptoms at this time. Will evaluate with CBC w/diff, CMP, HIV and RPR.  Patient would also benefit from dermatology referral.

## 2019-08-22 LAB — CMP14 + ANION GAP
ALT: 8 IU/L (ref 0–32)
AST: 13 IU/L (ref 0–40)
Albumin/Globulin Ratio: 1.6 (ref 1.2–2.2)
Albumin: 4.4 g/dL (ref 3.8–4.8)
Alkaline Phosphatase: 117 IU/L (ref 48–121)
Anion Gap: 12 mmol/L (ref 10.0–18.0)
BUN/Creatinine Ratio: 13 (ref 9–23)
BUN: 10 mg/dL (ref 6–24)
Bilirubin Total: 0.4 mg/dL (ref 0.0–1.2)
CO2: 25 mmol/L (ref 20–29)
Calcium: 9.6 mg/dL (ref 8.7–10.2)
Chloride: 103 mmol/L (ref 96–106)
Creatinine, Ser: 0.8 mg/dL (ref 0.57–1.00)
GFR calc Af Amer: 101 mL/min/{1.73_m2} (ref >59)
GFR calc non Af Amer: 87 mL/min/{1.73_m2} (ref >59)
Globulin, Total: 2.7 g/dL (ref 1.5–4.5)
Glucose: 79 mg/dL (ref 65–99)
Potassium: 4.3 mmol/L (ref 3.5–5.2)
Sodium: 140 mmol/L (ref 134–144)
Total Protein: 7.1 g/dL (ref 6.0–8.5)

## 2019-08-22 LAB — CBC WITH DIFFERENTIAL/PLATELET
Basophils Absolute: 0.1 10*3/uL (ref 0.0–0.2)
Basos: 1 %
EOS (ABSOLUTE): 0 10*3/uL (ref 0.0–0.4)
Eos: 0 %
Hematocrit: 46.1 % (ref 34.0–46.6)
Hemoglobin: 15.4 g/dL (ref 11.1–15.9)
Immature Grans (Abs): 0 10*3/uL (ref 0.0–0.1)
Immature Granulocytes: 0 %
Lymphocytes Absolute: 2.4 10*3/uL (ref 0.7–3.1)
Lymphs: 22 %
MCH: 29.2 pg (ref 26.6–33.0)
MCHC: 33.4 g/dL (ref 31.5–35.7)
MCV: 87 fL (ref 79–97)
Monocytes Absolute: 0.6 10*3/uL (ref 0.1–0.9)
Monocytes: 6 %
Neutrophils Absolute: 7.5 10*3/uL — ABNORMAL HIGH (ref 1.4–7.0)
Neutrophils: 71 %
Platelets: 264 10*3/uL (ref 150–450)
RBC: 5.28 x10E6/uL (ref 3.77–5.28)
RDW: 12.8 % (ref 11.7–15.4)
WBC: 10.6 10*3/uL (ref 3.4–10.8)

## 2019-08-22 LAB — RPR: RPR Ser Ql: NONREACTIVE

## 2019-08-22 LAB — HIV ANTIBODY (ROUTINE TESTING W REFLEX): HIV Screen 4th Generation wRfx: NONREACTIVE

## 2019-08-24 NOTE — Progress Notes (Signed)
Internal Medicine Clinic Attending  I saw and evaluated the patient.  I personally confirmed the key portions of the history and exam documented by Dr. Mcarthur Rossetti and I reviewed pertinent patient test results.  The assessment, diagnosis, and plan were formulated together and I agree with the documentation in the resident's note. Agree with ENT referral.  Also think that dermatology referral may be warranted.  Overall skin lesions appear to be to be unroofed bug bites but not typical location for scabies or bedbugs.  She also denies significant pruritus.  Agree with checking HIV RPR and CBC with differential.  I discussed with her I think holding off antibiotics given no clear infectious source.

## 2019-08-25 ENCOUNTER — Telehealth: Payer: Self-pay | Admitting: Student

## 2019-08-25 NOTE — Telephone Encounter (Signed)
Pt rtn call back about her test Results.

## 2019-08-26 ENCOUNTER — Telehealth: Payer: Self-pay | Admitting: *Deleted

## 2019-08-26 NOTE — Telephone Encounter (Signed)
Attempted to call pt x2, went to voicemail. Pt can be notified that results are nl and to f/u with dermatology and ENT.

## 2019-08-26 NOTE — Telephone Encounter (Signed)
Rtc, lm for rtc 

## 2019-08-26 NOTE — Telephone Encounter (Signed)
Spoke w/ pt, informed her, labs are wnl She ask how she would pay for derm and ent, encouraged her greatly to followup closely for orange card and medicaid

## 2019-08-26 NOTE — Telephone Encounter (Signed)
Requesting to speak with a nurse about test results, please call pt back.  ?

## 2019-09-01 ENCOUNTER — Telehealth: Payer: Self-pay | Admitting: Licensed Clinical Social Worker

## 2019-09-01 ENCOUNTER — Encounter: Payer: Self-pay | Admitting: Licensed Clinical Social Worker

## 2019-09-01 NOTE — Telephone Encounter (Signed)
Patient was called to discuss a referral for services. Patient did not answer, and a vm was left for the patient to contact our office to schedule. A letter was also mailed today.

## 2019-09-28 ENCOUNTER — Ambulatory Visit: Payer: Medicaid Other

## 2019-09-30 ENCOUNTER — Encounter: Payer: Self-pay | Admitting: *Deleted

## 2019-10-21 ENCOUNTER — Ambulatory Visit: Payer: Medicaid Other

## 2019-10-27 NOTE — Addendum Note (Signed)
Addended by: Neomia Dear on: 10/27/2019 02:51 PM   Modules accepted: Orders

## 2019-11-09 ENCOUNTER — Other Ambulatory Visit: Payer: Self-pay

## 2019-11-09 ENCOUNTER — Emergency Department (HOSPITAL_BASED_OUTPATIENT_CLINIC_OR_DEPARTMENT_OTHER)
Admission: EM | Admit: 2019-11-09 | Discharge: 2019-11-09 | Disposition: A | Discharge status: Home / Self Care | Payer: Medicaid Other | Attending: Emergency Medicine | Admitting: Emergency Medicine

## 2019-11-09 ENCOUNTER — Encounter (HOSPITAL_BASED_OUTPATIENT_CLINIC_OR_DEPARTMENT_OTHER): Payer: Self-pay | Admitting: *Deleted

## 2019-11-09 DIAGNOSIS — S2096XA Insect bite (nonvenomous) of unspecified parts of thorax, initial encounter: Secondary | ICD-10-CM | POA: Insufficient documentation

## 2019-11-09 DIAGNOSIS — S40869A Insect bite (nonvenomous) of unspecified upper arm, initial encounter: Secondary | ICD-10-CM | POA: Insufficient documentation

## 2019-11-09 DIAGNOSIS — S0086XA Insect bite (nonvenomous) of other part of head, initial encounter: Secondary | ICD-10-CM | POA: Insufficient documentation

## 2019-11-09 DIAGNOSIS — W57XXXA Bitten or stung by nonvenomous insect and other nonvenomous arthropods, initial encounter: Secondary | ICD-10-CM | POA: Insufficient documentation

## 2019-11-09 DIAGNOSIS — F17210 Nicotine dependence, cigarettes, uncomplicated: Secondary | ICD-10-CM | POA: Insufficient documentation

## 2019-11-09 DIAGNOSIS — Y9259 Other trade areas as the place of occurrence of the external cause: Secondary | ICD-10-CM | POA: Insufficient documentation

## 2019-11-09 DIAGNOSIS — Y9389 Activity, other specified: Secondary | ICD-10-CM | POA: Insufficient documentation

## 2019-11-09 DIAGNOSIS — S30860A Insect bite (nonvenomous) of lower back and pelvis, initial encounter: Secondary | ICD-10-CM | POA: Insufficient documentation

## 2019-11-09 MED ORDER — CEPHALEXIN 500 MG PO CAPS: 500.0000 mg | ORAL_CAPSULE | Freq: Four times a day (QID) | ORAL | 0 refills | Status: DC

## 2019-11-09 NOTE — ED Triage Notes (Signed)
Pt c/o  Rash and insect bites to entire body x 2 days

## 2019-11-09 NOTE — Discharge Instructions (Addendum)
This is likely insect bites.  We will treat you with some antibiotics. Benadryl for itching.  Motrin and Tylenol for pain as well.  If you develop any fevers, worsening pain return to the ER.

## 2019-11-09 NOTE — ED Provider Notes (Signed)
[facility] EMERGENCY DEPARTMENT Provider Note   CSN: [id number] Arrival date & time: 11/09/19  1656     History Chief Complaint  Patient presents with   Insect Bite    [name] is a 49 y.o. female.  HPI 49 year old female presents the ER for rash.  Patient reports that she stated a hotel over the weekend she is concerned she may have bedbugs.  She reports painful rash to her face, arms, back and chest.  Patient reports that she did have pictures of bugs in her bed at the hotel.  Patient has had no fevers or chills.  Patient has been seen several times in the past several weeks for rash.  No fevers or chills.  She is taken no medications for symptoms prior to arrival.    Past Medical History:  Diagnosis Date   Anxiety and depression    History of hepatitis C     Patient Active Problem List   Diagnosis Date Noted   Skin lesions, generalized 08/21/2019   Perforated tympanic membrane on examination, right 08/21/2019   High risk sexual behavior 01/29/2017   Vaginal odor 08/20/2016   Tobacco abuse 03/27/2016   Encounter for screening mammogram for breast cancer 03/27/2016   STD (female) 03/19/2016   Anxiety and depression 03/18/2016   History of hepatitis C     Past Surgical History:  Procedure Laterality Date   AUGMENTATION MAMMAPLASTY Bilateral    BREAST ENHANCEMENT SURGERY     CESAREAN SECTION     MYRINGOTOMY       OB History    Gravida  1   Para      Term      Preterm      AB      Living        SAB      TAB      Ectopic      Multiple      Live Births              Family History  Problem Relation Age of Onset   Cancer Other    Ovarian cancer Maternal Aunt        deceased    Social History   Tobacco Use   Smoking status: Current Every Day Smoker    Packs/day: 0.50    Years: 30.00    Pack years: 15.00    Types: Cigarettes   Smokeless tobacco: Never Used  Building services engineer Use: Every day    Substance Use Topics   Alcohol use: No   Drug use: Yes    Types: Marijuana    Home Medications Prior to Admission medications   Medication Sig Start Date End Date Taking? Authorizing Provider  cephALEXin (KEFLEX) 500 MG capsule Take 1 capsule (500 mg total) by mouth 4 (four) times daily. 11/09/19   Rise Mu, PA-C  hydrOXYzine (ATARAX/VISTARIL) 25 MG tablet Take 1 tablet (25 mg total) by mouth every 6 (six) hours as needed for itching. 06/22/19   Milagros Loll, MD    Allergies    Tylenol [acetaminophen]  Review of Systems   Review of Systems  Constitutional: Negative for chills and fever.  HENT: Negative for congestion.   Eyes: Negative for discharge.  Respiratory: Negative for cough.   Skin: Positive for rash.  Neurological: Negative for weakness and numbness.  Psychiatric/Behavioral: Negative for confusion.    Physical Exam Updated Vital Signs BP 133/88    Pulse 73  Temp 97.8 F (36.6 C) (Oral)    Resp 18    Ht 5\' 2"  (1.575 m)    Wt 53.1 kg    SpO2 99%    BMI 21.40 kg/m   Physical Exam Vitals and nursing note reviewed.  Constitutional:      General: She is not in acute distress.    Appearance: She is well-developed.  HENT:     Head: Normocephalic and atraumatic.  Eyes:     General: No scleral icterus.       Right eye: No discharge.        Left eye: No discharge.  Cardiovascular:     Pulses: Normal pulses.  Pulmonary:     Effort: No respiratory distress.  Musculoskeletal:        General: Normal range of motion.     Cervical back: Normal range of motion.  Skin:    Coloration: Skin is not pale.     Comments: Patient has several lesions to her face, neck, arms, back and chest.  Erythematous without any drainage.  These do appear to be healing in some locations.  There is no fluctuance.  Mild warmth noted.  Neurological:     Mental Status: She is alert.  Psychiatric:        Behavior: Behavior normal.        Thought Content: Thought content  normal.        Judgment: Judgment normal.     ED Results / Procedures / Treatments   Labs (all labs ordered are listed, but only abnormal results are displayed) Labs Reviewed - No data to display  EKG None  Radiology No results found.  Procedures Procedures (including critical care time)  Medications Ordered in ED Medications - No data to display  ED Course  I have reviewed the triage vital signs and the nursing notes.  Pertinent labs & imaging results that were available during my care of the patient were reviewed by me and considered in my medical decision making (see chart for details).    MDM Rules/Calculators/A&P                          Pt presents to ED with complaint of rash.  Unknown etiology but may be secondary to insect bite, contact dermatitis or possibly drug-induced.  Patient denies any difficulty breathing or swallowing. Patientt has a patent airway without stridor and is handling secretions without difficulty; no angioedema. No blisters, pustules, warmth, abscesses, bullous impetigo, vesicles, desquamation or target lesions. Rash tender to touch.  Will treat patient for secondary cellulitic infection.  Pt is hemodynamically stable, in NAD, & able to ambulate in the ED. Evaluation does not show pathology that would require ongoing emergent intervention or inpatient treatment. I explained the diagnosis to the patient. Pt has no complaints prior to dc. Pt is comfortable with above plan and is stable for discharge at this time. All questions were answered prior to disposition. Strict return precautions for f/u to the ED were discussed. Encouraged follow up with PCP.     Final Clinical Impression(s) / ED Diagnoses Final diagnoses:  Insect bite, unspecified site, initial encounter    Rx / DC Orders ED Discharge Orders         Ordered    cephALEXin (KEFLEX) 500 MG capsule  4 times daily        11/09/19 1823           11/11/19, PA-C  11/09/19  1839    Little, Ambrose Finland, MD 11/10/19 [phone]

## 2019-11-10 MED FILL — CEPHALEXIN 500 MG CAPSULE: 500 | 5 days supply | Qty: 20 | Fill #0

## 2019-11-25 ENCOUNTER — Emergency Department (HOSPITAL_COMMUNITY)
Admission: EM | Admit: 2019-11-25 | Discharge: 2019-11-25 | Disposition: A | Discharge status: Home / Self Care | Payer: Medicaid Other | Attending: Emergency Medicine | Admitting: Emergency Medicine

## 2019-11-25 ENCOUNTER — Encounter (HOSPITAL_COMMUNITY): Payer: Self-pay | Admitting: *Deleted

## 2019-11-25 ENCOUNTER — Other Ambulatory Visit: Payer: Self-pay

## 2019-11-25 DIAGNOSIS — S0006XA Insect bite (nonvenomous) of scalp, initial encounter: Secondary | ICD-10-CM | POA: Insufficient documentation

## 2019-11-25 DIAGNOSIS — S80869A Insect bite (nonvenomous), unspecified lower leg, initial encounter: Secondary | ICD-10-CM | POA: Insufficient documentation

## 2019-11-25 DIAGNOSIS — S40869A Insect bite (nonvenomous) of unspecified upper arm, initial encounter: Secondary | ICD-10-CM | POA: Insufficient documentation

## 2019-11-25 DIAGNOSIS — F17210 Nicotine dependence, cigarettes, uncomplicated: Secondary | ICD-10-CM | POA: Insufficient documentation

## 2019-11-25 DIAGNOSIS — R21 Rash and other nonspecific skin eruption: Secondary | ICD-10-CM

## 2019-11-25 DIAGNOSIS — W57XXXA Bitten or stung by nonvenomous insect and other nonvenomous arthropods, initial encounter: Secondary | ICD-10-CM | POA: Insufficient documentation

## 2019-11-25 DIAGNOSIS — S0086XA Insect bite (nonvenomous) of other part of head, initial encounter: Secondary | ICD-10-CM | POA: Insufficient documentation

## 2019-11-25 MED ORDER — DEXAMETHASONE SODIUM PHOSPHATE 10 MG/ML IJ SOLN
10.0000 mg | Freq: Once | INTRAMUSCULAR | Status: AC
  Administered 2019-11-25: 10 mg via INTRAMUSCULAR
  Filled 2019-11-25: qty 1

## 2019-11-25 MED ORDER — TRIAMCINOLONE ACETONIDE 0.1 % EX CREA: 1.0000 "application " | TOPICAL_CREAM | Freq: Two times a day (BID) | CUTANEOUS | 0 refills | Status: DC

## 2019-11-25 NOTE — ED Provider Notes (Signed)
[facility] EMERGENCY DEPARTMENT Provider Note   CSN: [id number] Arrival date & time: 11/25/19  8185     History Chief Complaint  Patient presents with  . Rash  . bug bites    [name] is a 49 y.o. female.  Patient presents for evaluation of multiple insect bites. Located on scalp, face, upper and lower extremities, torso. She has been seen multiple times and at a variety of emergency departments since March of this year. She has an exacerbation of symptoms when she stays at her camper and when she stays at a local motel. She has been on antibiotics, and treated for scabies with elimite.   The history is provided by the patient and medical records.  Rash Location:  Full body Quality: redness and scaling   Duration:  6 months Timing:  Intermittent Progression:  Waxing and waning Chronicity:  Recurrent      Past Medical History:  Diagnosis Date  . Anxiety and depression   . History of hepatitis C     Patient Active Problem List   Diagnosis Date Noted  . Skin lesions, generalized 08/21/2019  . Perforated tympanic membrane on examination, right 08/21/2019  . High risk sexual behavior 01/29/2017  . Vaginal odor 08/20/2016  . Tobacco abuse 03/27/2016  . Encounter for screening mammogram for breast cancer 03/27/2016  . STD (female) 03/19/2016  . Anxiety and depression 03/18/2016  . History of hepatitis C     Past Surgical History:  Procedure Laterality Date  . AUGMENTATION MAMMAPLASTY Bilateral   . BREAST ENHANCEMENT SURGERY    . CESAREAN SECTION    . MYRINGOTOMY       OB History    Gravida  1   Para      Term      Preterm      AB      Living        SAB      TAB      Ectopic      Multiple      Live Births              Family History  Problem Relation Age of Onset  . Cancer Other   . Ovarian cancer Maternal Aunt        deceased    Social History   Tobacco Use  . Smoking status: Current Every Day Smoker     Packs/day: 0.50    Years: 30.00    Pack years: 15.00    Types: Cigarettes  . Smokeless tobacco: Never Used  Vaping Use  . Vaping Use: Every day  Substance Use Topics  . Alcohol use: No  . Drug use: Yes    Types: Marijuana    Home Medications Prior to Admission medications   Medication Sig Start Date End Date Taking? Authorizing Provider  cephALEXin (KEFLEX) 500 MG capsule Take 1 capsule (500 mg total) by mouth 4 (four) times daily. 11/09/19   Rise Mu, PA-C  hydrOXYzine (ATARAX/VISTARIL) 25 MG tablet Take 1 tablet (25 mg total) by mouth every 6 (six) hours as needed for itching. 06/22/19   Milagros Loll, MD    Allergies    Tylenol [acetaminophen]  Review of Systems   Review of Systems  Skin: Positive for rash.  All other systems reviewed and are negative.   Physical Exam Updated Vital Signs BP (!) 147/103   Pulse (!) 54   Temp 97.8 F (36.6 C) (Oral)   Resp 16  Ht 5\' 2"  (1.575 m)   Wt 52.6 kg   SpO2 100%   BMI 21.22 kg/m   Physical Exam Vitals and nursing note reviewed.  Constitutional:      Appearance: Normal appearance.  HENT:     Head: Normocephalic.     Nose: Nose normal.     Mouth/Throat:     Mouth: Mucous membranes are moist.  Eyes:     Conjunctiva/sclera: Conjunctivae normal.  Cardiovascular:     Rate and Rhythm: Normal rate.  Pulmonary:     Effort: Pulmonary effort is normal.  Abdominal:     General: Abdomen is flat.     Palpations: Abdomen is soft.  Musculoskeletal:        General: Normal range of motion.  Skin:    General: Skin is warm and dry.     Findings: Lesion and rash present.     Comments: Numerous bites on body, appears to be insect related. Multiple, localized, mildly erythematous lesions. None appear infected.  Neurological:     Mental Status: She is alert and oriented to person, place, and time.  Psychiatric:        Mood and Affect: Mood normal.        Behavior: Behavior normal.     ED Results / Procedures /  Treatments   Labs (all labs ordered are listed, but only abnormal results are displayed) Labs Reviewed - No data to display  EKG None  Radiology No results found.  Procedures Procedures (including critical care time)  Medications Ordered in ED Medications  dexamethasone (DECADRON) injection 10 mg (10 mg Intramuscular Given 11/25/19 1303)    ED Course  I have reviewed the triage vital signs and the nursing notes.  Pertinent labs & imaging results that were available during my care of the patient were reviewed by me and considered in my medical decision making (see chart for details).    MDM Rules/Calculators/A&P                          Patient with nonspecific eruption. No signs of infection. Discharge with symptomatic treatment. Follow up with PCP in 2-3 days.     Final Clinical Impression(s) / ED Diagnoses Final diagnoses:  Rash and nonspecific skin eruption    Rx / DC Orders ED Discharge Orders         Ordered    triamcinolone cream (KENALOG) 0.1 %  2 times daily        11/25/19 1302           11/27/19, NP 11/25/19 1435    11/27/19, DO 11/25/19 1616

## 2019-11-25 NOTE — ED Triage Notes (Signed)
To ED for eval of mite bug bites to entire body for the past month. Seen by MD and placed on antibiotics. Pt states she washed hair with lice shampoo 'and black bugs jumped out of my hair and back into these bites on my body'. States she feels like her 'throat is swelling'. Airway patent. Speech clear. Resp even and unlabored. No bugs visible on pt.

## 2019-11-29 ENCOUNTER — Emergency Department (HOSPITAL_BASED_OUTPATIENT_CLINIC_OR_DEPARTMENT_OTHER)
Admission: EM | Admit: 2019-11-29 | Discharge: 2019-11-30 | Disposition: A | Discharge status: Home / Self Care | Payer: Medicaid Other | Attending: Emergency Medicine | Admitting: Emergency Medicine

## 2019-11-29 ENCOUNTER — Other Ambulatory Visit: Payer: Self-pay

## 2019-11-29 DIAGNOSIS — F424 Excoriation (skin-picking) disorder: Secondary | ICD-10-CM

## 2019-11-29 DIAGNOSIS — F17210 Nicotine dependence, cigarettes, uncomplicated: Secondary | ICD-10-CM | POA: Insufficient documentation

## 2019-11-29 DIAGNOSIS — L989 Disorder of the skin and subcutaneous tissue, unspecified: Secondary | ICD-10-CM | POA: Insufficient documentation

## 2019-11-29 NOTE — ED Triage Notes (Signed)
Pt reports she was at [facility] yesterday and was attacked by bedbugs. States "they were eating on me". States this is an ongoing problem and she has taken parasite remover. Pt has multiple lesions noted on arms and face. Pt states these are from her "trying to dig them out". Reports she has been prescribed creams and medications but they are not effective. SHe used "lice medicine" last

## 2019-11-30 ENCOUNTER — Emergency Department (HOSPITAL_COMMUNITY)
Admission: EM | Admit: 2019-11-30 | Discharge: 2019-11-30 | Disposition: A | Discharge status: Home / Self Care | Payer: Medicaid Other | Attending: Emergency Medicine | Admitting: Emergency Medicine

## 2019-11-30 ENCOUNTER — Encounter (HOSPITAL_BASED_OUTPATIENT_CLINIC_OR_DEPARTMENT_OTHER): Payer: Self-pay | Admitting: *Deleted

## 2019-11-30 ENCOUNTER — Encounter (HOSPITAL_COMMUNITY): Payer: Self-pay

## 2019-11-30 DIAGNOSIS — H65191 Other acute nonsuppurative otitis media, right ear: Secondary | ICD-10-CM

## 2019-11-30 DIAGNOSIS — H9201 Otalgia, right ear: Secondary | ICD-10-CM | POA: Insufficient documentation

## 2019-11-30 DIAGNOSIS — F17210 Nicotine dependence, cigarettes, uncomplicated: Secondary | ICD-10-CM | POA: Insufficient documentation

## 2019-11-30 DIAGNOSIS — R21 Rash and other nonspecific skin eruption: Secondary | ICD-10-CM | POA: Insufficient documentation

## 2019-11-30 MED ORDER — HYDROXYZINE HCL 25 MG PO TABS: 25.0000 mg | ORAL_TABLET | Freq: Three times a day (TID) | ORAL | 0 refills | Status: DC | PRN

## 2019-11-30 MED ORDER — FLUTICASONE PROPIONATE 50 MCG/ACT NA SUSP: 1.0000 | Freq: Every day | NASAL | 0 refills | Status: DC

## 2019-11-30 MED ORDER — HYDROXYZINE HCL 25 MG PO TABS: 25.0000 mg | ORAL_TABLET | Freq: Four times a day (QID) | ORAL | 0 refills | Status: DC | PRN

## 2019-11-30 NOTE — ED Triage Notes (Signed)
Pt states that was seen the other day after being bitten by bedbugs or lice, used lice treatment and was given steroid shot without relief. Pt thinks bugs are under her skin, denies drug use.

## 2019-11-30 NOTE — Discharge Instructions (Signed)
Use flonase to help with fluid in your ear.  Take hydoxyzine up to 3 times a day as needed for itching.  Follow up with the primary care and behavioral health resources listed below.  Return to the ER with any new, worsening, or concerning symptoms.

## 2019-11-30 NOTE — ED Notes (Signed)
Pt  Called multiple times by EMT and registration with no answer. Pt taken OTF.

## 2019-11-30 NOTE — ED Provider Notes (Signed)
[facility] EMERGENCY DEPARTMENT Provider Note   CSN: [id number] Arrival date & time: 11/30/19  0048     History Chief Complaint  Patient presents with  . Rash    [name] is a 49 y.o. female presenting for evaluation of rash.   Pt states for the past year she has had bugs coming out of her skin.  She states she scratches until she developed scabs and knees hurt.  She is worried she has an Architect as well as mites coming out of her scalp.  She has been seen multiple times for this, states she is using creams without improvement.  She is not taking any pills.  She does not have primary care, nor has she been evaluated by dermatology or psych.  She reports no other medical problems, takes medications daily.  Additional history obtained from chart review.  Patient has been seen multiple times for rash, insect bites, and otitis media  HPI     Past Medical History:  Diagnosis Date  . Anxiety and depression   . History of hepatitis C     Patient Active Problem List   Diagnosis Date Noted  . Skin lesions, generalized 08/21/2019  . Perforated tympanic membrane on examination, right 08/21/2019  . High risk sexual behavior 01/29/2017  . Vaginal odor 08/20/2016  . Tobacco abuse 03/27/2016  . Encounter for screening mammogram for breast cancer 03/27/2016  . STD (female) 03/19/2016  . Anxiety and depression 03/18/2016  . History of hepatitis C     Past Surgical History:  Procedure Laterality Date  . AUGMENTATION MAMMAPLASTY Bilateral   . BREAST ENHANCEMENT SURGERY    . CESAREAN SECTION    . MYRINGOTOMY       OB History    Gravida  1   Para      Term      Preterm      AB      Living        SAB      TAB      Ectopic      Multiple      Live Births              Family History  Problem Relation Age of Onset  . Cancer Other   . Ovarian cancer Maternal Aunt        deceased    Social History   Tobacco Use  . Smoking status:  Current Every Day Smoker    Packs/day: 0.50    Years: 30.00    Pack years: 15.00    Types: Cigarettes  . Smokeless tobacco: Never Used  Vaping Use  . Vaping Use: Some days  Substance Use Topics  . Alcohol use: No  . Drug use: Not Currently    Types: Marijuana    Comment: denies    Home Medications Prior to Admission medications   Medication Sig Start Date End Date Taking? Authorizing Provider  fluticasone (FLONASE) 50 MCG/ACT nasal spray Place 1 spray into both nostrils daily. 11/30/19   Carrell Rahmani, PA-C  hydrOXYzine (ATARAX/VISTARIL) 25 MG tablet Take 1 tablet (25 mg total) by mouth every 8 (eight) hours as needed for itching. 11/30/19   Syrianna Schillaci, PA-C    Allergies    Tylenol [acetaminophen]  Review of Systems   Review of Systems  HENT:       Right ear discomfort  Skin: Positive for rash.    Physical Exam Updated Vital Signs BP 112/83  Pulse 79   Temp 98.2 F (36.8 C) (Oral)   Resp 18   SpO2 97%   Physical Exam Vitals and nursing note reviewed.  Constitutional:      General: She is not in acute distress.    Appearance: She is well-developed.     Comments: Appears nontoxic  HENT:     Head: Normocephalic and atraumatic.     Right Ear: A middle ear effusion is present.     Ears:     Comments: Effusion behind the right TM.  No erythema or bulging of the TM. Cardiovascular:     Rate and Rhythm: Normal rate and regular rhythm.     Pulses: Normal pulses.  Pulmonary:     Effort: Pulmonary effort is normal.     Breath sounds: Normal breath sounds.  Abdominal:     General: There is no distension.  Musculoskeletal:        General: Normal range of motion.     Cervical back: Normal range of motion.  Skin:    General: Skin is warm.     Capillary Refill: Capillary refill takes less than 2 seconds.     Findings: Rash present.     Comments: Scabs noted all over the body and areas that are reachable by patient's hands.  No drainage, induration,  or  signs of infection.  Neurological:     Mental Status: She is alert and oriented to person, place, and time.     ED Results / Procedures / Treatments   Labs (all labs ordered are listed, but only abnormal results are displayed) Labs Reviewed - No data to display  EKG None  Radiology No results found.  Procedures Procedures (including critical care time)  Medications Ordered in ED Medications - No data to display  ED Course  I have reviewed the triage vital signs and the nursing notes.  Pertinent labs & imaging results that were available during my care of the patient were reviewed by me and considered in my medical decision making (see chart for details).    MDM Rules/Calculators/A&P                          Patient presenting for evaluation of rash and right ear discomfort. Exam, patient has scabs that appear to be self-inflicted, in areas that are reachable by patient's hand.  No scabs on the back.  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  Consider morgellans. Consider psych.  Patient does have an effusion on the right side, though no signs of AOM.  Will treat with flonase.  Discussed rash with patient.  Discussed potential psychiatric causes.  Discussed follow-up with PCP and behavioral health.  At this time, patient appears safe for discharge.  Return precautions given.  Patient states she understands and agrees to plan.  Final Clinical Impression(s) / ED Diagnoses Final diagnoses:  Rash and nonspecific skin eruption  Acute effusion of right ear    Rx / DC Orders ED Discharge Orders         Ordered    hydrOXYzine (ATARAX/VISTARIL) 25 MG tablet  Every 8 hours PRN,   Status:  Discontinued        11/30/19 1548    fluticasone (FLONASE) 50 MCG/ACT nasal spray  Daily,   Status:  Discontinued        11/30/19 1548    fluticasone (FLONASE) 50 MCG/ACT nasal spray  Daily,   Status:  Discontinued        11/30/19 1552    hydrOXYzine  (ATARAX/VISTARIL) 25 MG tablet  Every 8 hours PRN,   Status:  Discontinued        11/30/19 1552    fluticasone (FLONASE) 50 MCG/ACT nasal spray  Daily        11/30/19 1555    hydrOXYzine (ATARAX/VISTARIL) 25 MG tablet  Every 8 hours PRN        11/30/19 1555           Denver Harder, PA-C 11/30/19 2106    Pollyann Savoy, MD 12/01/19 [phone]

## 2019-11-30 NOTE — ED Notes (Signed)
Left prior to receiving discharge instructions. No belongings left in room.

## 2019-11-30 NOTE — ED Provider Notes (Signed)
[facility] EMERGENCY DEPARTMENT Provider Note   CSN: [id number] Arrival date & time: 11/29/19  2335     History Chief Complaint  Patient presents with  . Insect Bite    [name] is a 49 y.o. female.  Patient presents to the emergency department stating that she has been getting bitten by bugs for a year.  She reports that there are mites digging in her scalp and bugs digging in her skin everywhere else.  She has used multiple topical medications without help.  She has been seen in the ED multiple times.        Past Medical History:  Diagnosis Date  . Anxiety and depression   . History of hepatitis C     Patient Active Problem List   Diagnosis Date Noted  . Skin lesions, generalized 08/21/2019  . Perforated tympanic membrane on examination, right 08/21/2019  . High risk sexual behavior 01/29/2017  . Vaginal odor 08/20/2016  . Tobacco abuse 03/27/2016  . Encounter for screening mammogram for breast cancer 03/27/2016  . STD (female) 03/19/2016  . Anxiety and depression 03/18/2016  . History of hepatitis C     Past Surgical History:  Procedure Laterality Date  . AUGMENTATION MAMMAPLASTY Bilateral   . BREAST ENHANCEMENT SURGERY    . CESAREAN SECTION    . MYRINGOTOMY       OB History    Gravida  1   Para      Term      Preterm      AB      Living        SAB      TAB      Ectopic      Multiple      Live Births              Family History  Problem Relation Age of Onset  . Cancer Other   . Ovarian cancer Maternal Aunt        deceased    Social History   Tobacco Use  . Smoking status: Current Every Day Smoker    Packs/day: 0.50    Years: 30.00    Pack years: 15.00    Types: Cigarettes  . Smokeless tobacco: Never Used  Vaping Use  . Vaping Use: Some days  Substance Use Topics  . Alcohol use: No  . Drug use: Not Currently    Types: Marijuana    Comment: denies    Home Medications Prior to Admission medications     Medication Sig Start Date End Date Taking? Authorizing Provider  cephALEXin (KEFLEX) 500 MG capsule Take 1 capsule (500 mg total) by mouth 4 (four) times daily. 11/09/19   Rise Mu, PA-C  hydrOXYzine (ATARAX/VISTARIL) 25 MG tablet Take 1 tablet (25 mg total) by mouth every 6 (six) hours as needed for itching. 06/22/19   Milagros Loll, MD  triamcinolone cream (KENALOG) 0.1 % Apply 1 application topically 2 (two) times daily. 11/25/19   Felicie Morn, NP    Allergies    Tylenol [acetaminophen]  Review of Systems   Review of Systems  Skin: Positive for wound.  All other systems reviewed and are negative.   Physical Exam Updated Vital Signs BP (!) 134/98 (BP Location: Right Arm)   Pulse 78   Temp 98.2 F (36.8 C) (Oral)   Resp 16   Ht 5\' 2"  (1.575 m)   Wt 52 kg   SpO2 97%   BMI 20.97 kg/m  Physical Exam Vitals and nursing note reviewed.  Constitutional:      General: She is not in acute distress.    Appearance: Normal appearance. She is well-developed.  HENT:     Head: Normocephalic and atraumatic.     Right Ear: Hearing normal.     Left Ear: Hearing normal.     Nose: Nose normal.  Eyes:     Conjunctiva/sclera: Conjunctivae normal.     Pupils: Pupils are equal, round, and reactive to light.  Cardiovascular:     Rate and Rhythm: Regular rhythm.     Heart sounds: S1 normal and S2 normal. No murmur heard.  No friction rub. No gallop.   Pulmonary:     Effort: Pulmonary effort is normal. No respiratory distress.     Breath sounds: Normal breath sounds.  Chest:     Chest wall: No tenderness.  Abdominal:     General: Bowel sounds are normal.     Palpations: Abdomen is soft.     Tenderness: There is no abdominal tenderness. There is no guarding or rebound. Negative signs include Murphy's sign and McBurney's sign.     Hernia: No hernia is present.  Musculoskeletal:        General: Normal range of motion.     Cervical back: Normal range of motion and neck  supple.  Skin:    General: Skin is warm and dry.     Findings: No rash.     Comments: Multiple scabs on skin that is reachable, nothing in the areas of the back that she cannot reach to scratch  Neurological:     Mental Status: She is alert and oriented to person, place, and time.     GCS: GCS eye subscore is 4. GCS verbal subscore is 5. GCS motor subscore is 6.     Cranial Nerves: No cranial nerve deficit.     Sensory: No sensory deficit.     Coordination: Coordination normal.  Psychiatric:        Speech: Speech normal.        Behavior: Behavior normal.        Thought Content: Thought content normal.     ED Results / Procedures / Treatments   Labs (all labs ordered are listed, but only abnormal results are displayed) Labs Reviewed - No data to display  EKG None  Radiology No results found.  Procedures Procedures (including critical care time)  Medications Ordered in ED Medications - No data to display  ED Course  I have reviewed the triage vital signs and the nursing notes.  Pertinent labs & imaging results that were available during my care of the patient were reviewed by me and considered in my medical decision making (see chart for details).    MDM Rules/Calculators/A&P                          Patient with lesions that appear to be secondary to picking behavior.  She denies meth use.  Lesions are only on areas of her skin that she can reach to scratch herself, not on the lower, mid back but they are on on the reachable parts of the shoulders.  Patient denies scratching or picking at herself.  Patient has had at least 5 ER visits for similar symptoms over the last year and a half.  This appears to be a chronic problem.  There does not appear to be any superinfections that require specific treatment.  Final Clinical Impression(s) / ED Diagnoses Final diagnoses:  None    Rx / DC Orders ED Discharge Orders    None       Reyes Fifield, Canary Brim, MD 11/30/19  0021

## 2019-11-30 NOTE — ED Notes (Signed)
Pt is sleeping in chair in the lobby

## 2020-01-05 ENCOUNTER — Telehealth: Payer: Self-pay

## 2020-01-05 ENCOUNTER — Ambulatory Visit (INDEPENDENT_AMBULATORY_CARE_PROVIDER_SITE_OTHER): Payer: Self-pay | Admitting: Internal Medicine

## 2020-01-05 VITALS — BP 133/93 | HR 77 | Temp 98.0°F | Wt 116.2 lb

## 2020-01-05 DIAGNOSIS — J069 Acute upper respiratory infection, unspecified: Secondary | ICD-10-CM

## 2020-01-05 DIAGNOSIS — L989 Disorder of the skin and subcutaneous tissue, unspecified: Secondary | ICD-10-CM

## 2020-01-05 DIAGNOSIS — F419 Anxiety disorder, unspecified: Secondary | ICD-10-CM

## 2020-01-05 DIAGNOSIS — L03811 Cellulitis of head [any part, except face]: Secondary | ICD-10-CM | POA: Insufficient documentation

## 2020-01-05 DIAGNOSIS — F32A Depression, unspecified: Secondary | ICD-10-CM

## 2020-01-05 MED ORDER — BUPROPION HCL ER (SR) 100 MG PO TB12: 100.0000 mg | ORAL_TABLET | Freq: Two times a day (BID) | ORAL | 0 refills | Status: DC

## 2020-01-05 NOTE — Telephone Encounter (Signed)
Pls contact pt [phone] regarding mices, bed bugs and parasites

## 2020-01-05 NOTE — Assessment & Plan Note (Signed)
Ms. Christo states that she has a past medical history of an anxiety and depression that was previously treated with Wellbutrin and Klonopin.  She states she has not followed up with psychiatry in an extended period of time.  She notes that in the past year since starting to have the trouble with the bug bites, she has been feeling increasingly isolated, as her family keeps telling her that is on her head.  She feels like she cannot go out in public because people stare at her juncture.  In the past few months, she has been feeling increasingly overwhelmed and down and depressed.  She notes that some days she wakes up and is just so overwhelmed, that she just thinks she might be better off dead.  She denies any plan or thought to go through with suicide though.  At this time, Ms. Serio is in agreement to see psychiatry again and is requesting her medications be refilled.  I counseled that I will refill her Wellbutrin today, but will hold off for psychiatry to weigh in on whether she needs a benzo.  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  No evidence that she is in a current crisis that requires hospitalization though.  I placed a referral to [facility] and discussed patient's case with Dr. Monna Fam, who plans to be in contact with the patient early next week.  -Referral to [facility] -Refilled Wellbutrin 100 mg twice daily

## 2020-01-05 NOTE — Patient Instructions (Addendum)
It was nice seeing you today! Thank you for choosing [facility] for your Primary Care.    Today we talked about:   1. Bug Bites with infection of the skin: I recommend you pick up the prescriptions from the Crisis Center start the treatment course. Let us know if your skin infections do not resolve in the next 1 week.   2. Anxiety with Depression: I placed a referral to the [facility] department to help with going through this tough time.   3. We tested you for COVID-19 today. We will call you with the results.

## 2020-01-05 NOTE — Assessment & Plan Note (Signed)
[name] states that she has been noticing very painful nodules appearing all over her scalp and now she has 1 on her left temporal region.  Please see skin lesions, generalized For more details.  Assessment/plan: The skin lesion she has on her body do not appear infected, however the lesions she has on her scalp definitely appear infected.  They are erythematous raised, however nonfluctuant.  There is no purulent drainage on examination that can be expressed.  Yesterday at the ED, she is prescribed doxycycline to treat for this and I recommended she go ahead and pick up that prescription and start the treatment course.  I counseled that if she does not have an adequate response in the next 7 days or so, to please contact the clinic.  In that case, she may benefit from the addition of another agent, such as Augmentin.

## 2020-01-05 NOTE — Telephone Encounter (Signed)
Recommend she continue on the medication's prescribed to her yesterday and that she call to schedule a post emergency department follow up appointment with our clinic.

## 2020-01-05 NOTE — Assessment & Plan Note (Addendum)
[name] states that she continues to have bug bites all over her extremities and scalp that is been significantly bothersome for her.  She feels like the source is her camper as there is a lot of moisture and she feels like this attracts bugs.  She has tried bug bombs and has not had significant improvement in symptoms or bite frequency with that.  She has been seen in the ED for this several times, specially in the last 2 months, and has been prescribed several rounds of antibiotics.  [name] states that they help for short period of time.  She states that she did not bring the bag of bugs she is collected from the camper with her today.  She also feels like she continues to have bugs around her camper that she sees, and was concerned about " sperm shaped bugs coming out of her ceiling and infesting her dog's water."  [name] states that she has even felt that there is a bug on her pelvis area that crawled into her vaginal area.  She is concerned that the bugs are crawling into her ears.  She notes that her dog recently had a parasitic infection and was wondering if this may be contributing or if she may even have a parasitic infection herself.  She is requesting to be tested for this today.  Assessment/plan: Patient does have many small lesions that are scabbed over on her extremities and several larger lesions that are infected throughout her scalp and on her left temporal region.  The difficult to say if these are truly bug bites or if patient is picking/irritating the skin.  I do not see any bugs on examination.  I am concerned that patient is having visual and tactile hallucinations in light of her history of schizophrenia that is currently untreated.  We will place an urgent referral to psychiatry and appease patient by checking blood counts; counseled that if her eosinophils are low or normal, that essentially rules out any parasitic infection.  [name] is in agreement with this plan.  I also  recommended that she pick up the antibiotics both topical and oral prescribed by the ED yesterday and and start the course.  ADDENDUM:  Negative CBC. Patient informed and expressed understanding

## 2020-01-05 NOTE — Telephone Encounter (Signed)
Pt was called / informed to continue medications as prescribed by the doctor yesterday and schedule an in person appt. She agreed. Appt scheduled today @ 1445 PM with Dr Huel Cote.

## 2020-01-05 NOTE — Telephone Encounter (Signed)
Return pt's call - states she has been living in a camper which has been sitting for a long time and she has kept the furniture. States for over a year she has been dealing with "bities" which she states are mites. And this has been getting worse. States she had blood work done on her dog which she was told he has parasites in his blood. So she wants blood work done on herself; stated she has been to [facility], [facility] and [facility] and no one would drawn her blood. States she went to [facility] was given cream and abx. States she's not crazy; she just wants blood work done to check for parasites. And she wants to know if her doctor here can do this?

## 2020-01-05 NOTE — Telephone Encounter (Signed)
Pt also went to [place] got medicine, pt is wanted a blood test

## 2020-01-05 NOTE — Assessment & Plan Note (Addendum)
Ms. Luo states that she experienced a loss of smell approximately 1 week ago in addition to a sore throat and runny nose after exposure to her daughter who was tested for Covid and was positive. She also endorses a productive cough. Requesting to be Covid tested today.  She denies any chest pain or difficulty breathing.    Assessment/plan: Patient symptoms consistent with a viral URI and in light of the exposure, will test for COVID-19 to direct quarantine.  -Covid testing pending  Addendum:  COVID-19 negative. Counseled that symptoms likely related to another virus and will self-resolve with time. Patient expressed understanding.

## 2020-01-05 NOTE — Progress Notes (Signed)
   CC: Bug bites; covid-19 testing  HPI:  [name] is a 49 y.o. with a PMHx recurrent bug bite symptoms for +1 year, anxiety, depression, who presents to the clinic for bug bites;covid-19 testing.   Please see the Encounters tab for problem-based Assessment & Plan regarding status of patient's acute and chronic conditions.  Past Medical History:  Diagnosis Date  . Anxiety and depression   . History of hepatitis C    Review of Systems: Review of Systems  Constitutional: Positive for malaise/fatigue. Negative for chills, fever and weight loss.  HENT: Positive for congestion, ear pain and hearing loss. Negative for sinus pain.   Eyes: Positive for blurred vision (over the last year). Negative for double vision and photophobia.  Respiratory: Positive for cough. Negative for shortness of breath, wheezing and stridor.   Cardiovascular: Negative for chest pain, palpitations and leg swelling.  Gastrointestinal: Negative for abdominal pain, blood in stool, constipation, diarrhea, melena, nausea and vomiting.  Skin: Positive for itching and rash.  Neurological: Negative for dizziness, focal weakness, weakness and headaches.  Psychiatric/Behavioral: Positive for depression and suicidal ideas. Negative for hallucinations and substance abuse. The patient is nervous/anxious and has insomnia.    Physical Exam:  Vitals:   01/05/20 1405  BP: (!) 133/93  Pulse: 77  Temp: 98 F (36.7 C)  TempSrc: Oral  SpO2: 99%  Weight: 116 lb 3.2 oz (52.7 kg)   Physical Exam Vitals and nursing note reviewed.  Constitutional:      General: She is not in acute distress.    Appearance: She is normal weight.  HENT:     Head: Normocephalic and atraumatic. No raccoon eyes, abrasion, right periorbital erythema or left periorbital erythema.     Right Ear: No drainage. There is no impacted cerumen. No foreign body. Tympanic membrane is injected and erythematous.     Left Ear: No drainage. There is no impacted  cerumen. No foreign body. Tympanic membrane is injected, erythematous and bulging.  Skin:    General: Skin is warm and dry.     Coloration: Skin is pale.     Findings: Wound present.     Comments:  Throughout scalp and on the left temporal region, patient has multiple 2-3 cm erythematous raised lesions with central opening that is scabbed over. No active drainage. Largest lesion on left temporal lesion without fluctuation.   Numerous small 2-3 mm scabs all over bilateral upper and lower extremities. All in various stages of healing. No scab with signs of infection.   Neurological:     Mental Status: She is alert.  Psychiatric:        Attention and Perception: She is inattentive.        Mood and Affect: Mood is anxious and depressed.        Speech: Speech normal.        Behavior: Behavior is cooperative.        Thought Content: Thought content does not include suicidal ideation. Thought content does not include suicidal plan.        Cognition and Memory: Cognition and memory normal.        Judgment: Judgment normal.    Assessment & Plan:   See Encounters Tab for problem based charting.  Patient discussed with Dr. Heide Spark

## 2020-01-06 ENCOUNTER — Telehealth: Payer: Self-pay

## 2020-01-06 LAB — CBC WITH DIFFERENTIAL/PLATELET
Basophils Absolute: 0.1 10*3/uL (ref 0.0–0.2)
Basos: 1 %
EOS (ABSOLUTE): 0 10*3/uL (ref 0.0–0.4)
Eos: 1 %
Hematocrit: 39.7 % (ref 34.0–46.6)
Hemoglobin: 13.4 g/dL (ref 11.1–15.9)
Immature Grans (Abs): 0 10*3/uL (ref 0.0–0.1)
Immature Granulocytes: 0 %
Lymphocytes Absolute: 2.3 10*3/uL (ref 0.7–3.1)
Lymphs: 27 %
MCH: 29.5 pg (ref 26.6–33.0)
MCHC: 33.8 g/dL (ref 31.5–35.7)
MCV: 87 fL (ref 79–97)
Monocytes Absolute: 0.8 10*3/uL (ref 0.1–0.9)
Monocytes: 9 %
Neutrophils Absolute: 5.3 10*3/uL (ref 1.4–7.0)
Neutrophils: 62 %
Platelets: 224 10*3/uL (ref 150–450)
RBC: 4.55 x10E6/uL (ref 3.77–5.28)
RDW: 12.6 % (ref 11.7–15.4)
WBC: 8.4 10*3/uL (ref 3.4–10.8)

## 2020-01-06 LAB — NOVEL CORONAVIRUS, NAA: SARS-CoV-2, NAA: NOT DETECTED

## 2020-01-06 LAB — SARS-COV-2, NAA 2 DAY TAT

## 2020-01-06 NOTE — Telephone Encounter (Signed)
Requesting lab result, please call back.

## 2020-01-09 NOTE — Progress Notes (Signed)
Internal Medicine Clinic Attending  Case discussed with Dr. Basaraba  At the time of the visit.  We reviewed the resident's history and exam and pertinent patient test results.  I agree with the assessment, diagnosis, and plan of care documented in the resident's note.  

## 2020-01-11 ENCOUNTER — Encounter: Payer: Self-pay | Admitting: Student

## 2020-01-11 ENCOUNTER — Other Ambulatory Visit: Payer: Self-pay

## 2020-01-11 NOTE — Telephone Encounter (Signed)
Pt called and stated she was prescribed these 2 meds in [place] and needs them refilled and sent to [facility].

## 2020-01-11 NOTE — Telephone Encounter (Signed)
Patient stated on the day of our appointment that she was picking up those creams. It would be much too early to have already used the complete bottles.

## 2020-01-11 NOTE — Telephone Encounter (Signed)
Requesting to speak with a nurse about meds, pleases call pt back.

## 2020-01-14 ENCOUNTER — Telehealth: Payer: Self-pay

## 2020-01-14 NOTE — Telephone Encounter (Signed)
Pt is requesting a callback regarding medicine; pls call back after 2pm [phone]

## 2020-01-21 ENCOUNTER — Institutional Professional Consult (permissible substitution): Payer: Medicaid Other | Admitting: Behavioral Health

## 2020-01-28 ENCOUNTER — Telehealth: Payer: Self-pay | Admitting: *Deleted

## 2020-01-28 ENCOUNTER — Other Ambulatory Visit: Payer: Self-pay | Admitting: Internal Medicine

## 2020-01-28 DIAGNOSIS — B86 Scabies: Secondary | ICD-10-CM

## 2020-01-28 MED ORDER — PERMETHRIN 5 % EX CREA: 1.0000 "application " | TOPICAL_CREAM | Freq: Once | CUTANEOUS | 0 refills | Status: AC

## 2020-01-28 NOTE — Telephone Encounter (Signed)
I will send in prescription for the permethrin body cream given her history of scabies however will decline refilling the topical Flagyl and oral Flagyl.

## 2020-01-28 NOTE — Telephone Encounter (Signed)
Pt calls for a refill on metroNIDAZOLE 1% cream 60 grams w/ 2 refills and metroNIDAZOLE 500mg  tablet 1 tablet 2x daily#14 for her head, states she got it in [place], states its the only thing that works to kill the mites in her skull Also would like permethrin cream for her body 5%

## 2020-01-28 NOTE — Progress Notes (Signed)
Per chart review, patient has a history of scabies

## 2020-02-03 ENCOUNTER — Institutional Professional Consult (permissible substitution): Payer: Medicaid Other | Admitting: Behavioral Health

## 2020-02-03 ENCOUNTER — Other Ambulatory Visit: Payer: Self-pay | Admitting: Student

## 2020-02-03 ENCOUNTER — Telehealth: Payer: Self-pay

## 2020-02-03 DIAGNOSIS — L989 Disorder of the skin and subcutaneous tissue, unspecified: Secondary | ICD-10-CM

## 2020-02-03 MED ORDER — PERMETHRIN 5 % EX CREA: 1.0000 "application " | TOPICAL_CREAM | Freq: Once | CUTANEOUS | 0 refills | Status: AC

## 2020-02-03 NOTE — Telephone Encounter (Signed)
I will send in the prescription for permethrin 5% cream. Thank you

## 2020-02-03 NOTE — Telephone Encounter (Signed)
Received faxed refill request from pharmacy for Permethrin 5% Cream.  Medication has fallen off med list. SChaplin, RN,BSN

## 2020-02-23 ENCOUNTER — Other Ambulatory Visit: Payer: Self-pay

## 2020-02-23 ENCOUNTER — Encounter: Payer: Self-pay | Admitting: Student

## 2020-02-23 ENCOUNTER — Ambulatory Visit (INDEPENDENT_AMBULATORY_CARE_PROVIDER_SITE_OTHER): Payer: Self-pay | Admitting: Student

## 2020-02-23 VITALS — BP 112/85 | HR 72 | Temp 98.5°F | Ht 62.0 in | Wt 121.0 lb

## 2020-02-23 DIAGNOSIS — F17200 Nicotine dependence, unspecified, uncomplicated: Secondary | ICD-10-CM

## 2020-02-23 DIAGNOSIS — H66001 Acute suppurative otitis media without spontaneous rupture of ear drum, right ear: Secondary | ICD-10-CM

## 2020-02-23 DIAGNOSIS — L309 Dermatitis, unspecified: Secondary | ICD-10-CM

## 2020-02-23 DIAGNOSIS — R0989 Other specified symptoms and signs involving the circulatory and respiratory systems: Secondary | ICD-10-CM | POA: Insufficient documentation

## 2020-02-23 DIAGNOSIS — Z20822 Contact with and (suspected) exposure to covid-19: Secondary | ICD-10-CM

## 2020-02-23 DIAGNOSIS — R29898 Other symptoms and signs involving the musculoskeletal system: Secondary | ICD-10-CM | POA: Insufficient documentation

## 2020-02-23 DIAGNOSIS — R350 Frequency of micturition: Secondary | ICD-10-CM

## 2020-02-23 DIAGNOSIS — Z716 Tobacco abuse counseling: Secondary | ICD-10-CM

## 2020-02-23 DIAGNOSIS — R058 Other specified cough: Secondary | ICD-10-CM

## 2020-02-23 DIAGNOSIS — Z720 Tobacco use: Secondary | ICD-10-CM

## 2020-02-23 DIAGNOSIS — H7291 Unspecified perforation of tympanic membrane, right ear: Secondary | ICD-10-CM

## 2020-02-23 DIAGNOSIS — L989 Disorder of the skin and subcutaneous tissue, unspecified: Secondary | ICD-10-CM

## 2020-02-23 LAB — POCT URINALYSIS DIPSTICK
Bilirubin, UA: NEGATIVE
Blood, UA: NEGATIVE
Glucose, UA: NEGATIVE
Ketones, UA: NEGATIVE
Leukocytes, UA: NEGATIVE
Nitrite, UA: NEGATIVE
Protein, UA: NEGATIVE
Spec Grav, UA: 1.02 (ref 1.010–1.025)
Urobilinogen, UA: 0.2 E.U./dL
pH, UA: 7.5 (ref 5.0–8.0)

## 2020-02-23 MED ORDER — BUPROPION HCL ER (SR) 150 MG PO TB12: 150.0000 mg | ORAL_TABLET | Freq: Two times a day (BID) | ORAL | 2 refills | Status: DC

## 2020-02-23 MED ORDER — AMOXICILLIN-POT CLAVULANATE 875-125 MG PO TABS: 1.0000 | ORAL_TABLET | Freq: Two times a day (BID) | ORAL | 0 refills | Status: DC

## 2020-02-23 MED ORDER — HYDROCORTISONE 1 % EX CREA: TOPICAL_CREAM | CUTANEOUS | 1 refills | Status: AC

## 2020-02-23 MED ORDER — AMOXICILLIN-POT CLAVULANATE 875-125 MG PO TABS: 1.0000 | ORAL_TABLET | Freq: Two times a day (BID) | ORAL | 0 refills | Status: AC

## 2020-02-23 NOTE — Progress Notes (Signed)
   CC: Skin Rash  HPI:  [name] is a 50 y.o. woman with PMHx significant for HCV, Anxiety, Depression, and TUD, presenting with chief complaint of diffuse skin rash that has been on-going for many months. She also notes upper respiratory symptoms concerning for COVID-19 in the setting of close exposure. Please review problem-based assessment/plan for full details.   Past Medical History:  Diagnosis Date  . Anxiety and depression   . History of hepatitis C    Review of Systems:  All others negative except as noted in assessment / plan.   Physical Exam:  Vitals:   02/23/20 1426  BP: 112/85  Pulse: 72  Temp: 98.5 F (36.9 C)  TempSrc: Oral  SpO2: 97%  Weight: 121 lb (54.9 kg)  Height: 5\' 2"  (1.575 m)   General: Patient appears chronically ill. No acute distress.  Eyes: Sclera non-icteric. No conjunctival injection.  HENT: MMM. No nasal discharge. There is purulence and bulging of the right TM with tenderness with movement of the pinna, although without erythema of the canal. Left TM pearly and grey without bulging or erythema. No significant oropharyngeal erythema or edema. Respiratory: Lungs are CTA, bilaterally. No wheezes, rales, or rhonchi. No tachypnea or increased work of breathing. Cardiovascular: Regular rate and rhythm. No murmurs, rubs, or gallops.  Neurological: Alert and oriented x 3. CN II-XII grossly intact.  Abdominal: Soft and non-tender to palpation. No rebound or guarding. Skin: There are pea-sized punctate, erythematous lesions without purulence, surrounding erythema, or drainage with scabbing in various stages of healing on bilateral forearms, stomach, and back, with healed lesion on scalp.  Psych: Patient appears visibly anxious. Normal tone of voice.   Assessment & Plan:   See Encounters Tab for problem based charting.  Patient discussed with Dr. .  Criselda Peaches, MD 02/23/2020, 7:47 PM Pager: [phone]

## 2020-02-23 NOTE — Patient Instructions (Signed)
[name],   Today, you were checked for COVID-19. You should quarantine from others until you receive notification of your test result (I will call you with results when available).   I have prescribed Augmentin. Take 1 pill in the morning and 1 pill in the evening until you run out of pills.  You can use hydrocortisone cream as needed for your lesions.  For smoking cessation, take 1 pill of Wellbutrin daily for 3 days, then 1 pill in the morning and 1 pill in the evening thereafter.  It is best if you try to stop smoking 1 week AFTER you start taking wellbutrin.   Please go to the ED if you experience severe shortness of breath or inability to tolerate PO intake.   Call with any other questions or concerns [phone].  Thank you and take care,  Dr. Laddie Aquas      Person Under Monitoring Name: [name]  Location: [address] [place] [address]   Infection Prevention Recommendations for Individuals Confirmed to have, or Being Evaluated for, 2019 Novel Coronavirus (COVID-19) Infection Who Receive Care at Home  Individuals who are confirmed to have, or are being evaluated for, COVID-19 should follow the prevention steps below until a healthcare provider or local or state health department says they can return to normal activities.  Stay home except to get medical care You should restrict activities outside your home, except for getting medical care. Do not go to work, school, or public areas, and do not use public transportation or taxis.  Call ahead before visiting your doctor Before your medical appointment, call the healthcare provider and tell them that you have, or are being evaluated for, COVID-19 infection. This will help the healthcare provider's office take steps to keep other people from getting infected. Ask your healthcare provider to call the local or state health department.  Monitor your symptoms Seek prompt medical attention if your illness is worsening  (e.g., difficulty breathing). Before going to your medical appointment, call the healthcare provider and tell them that you have, or are being evaluated for, COVID-19 infection. Ask your healthcare provider to call the local or state health department.  Wear a facemask You should wear a facemask that covers your nose and mouth when you are in the same room with other people and when you visit a healthcare provider. People who live with or visit you should also wear a facemask while they are in the same room with you.  Separate yourself from other people in your home As much as possible, you should stay in a different room from other people in your home. Also, you should use a separate bathroom, if available.  Avoid sharing household items You should not share dishes, drinking glasses, cups, eating utensils, towels, bedding, or other items with other people in your home. After using these items, you should wash them thoroughly with soap and water.  Cover your coughs and sneezes Cover your mouth and nose with a tissue when you cough or sneeze, or you can cough or sneeze into your sleeve. Throw used tissues in a lined trash can, and immediately wash your hands with soap and water for at least 20 seconds or use an alcohol-based hand rub.  Wash your [employer] your hands often and thoroughly with soap and water for at least 20 seconds. You can use an alcohol-based hand sanitizer if soap and water are not available and if your hands are not visibly dirty. Avoid touching your eyes,  nose, and mouth with unwashed hands.   Prevention Steps for Caregivers and Household Members of Individuals Confirmed to have, or Being Evaluated for, COVID-19 Infection Being Cared for in the Home  If you live with, or provide care at home for, a person confirmed to have, or being evaluated for, COVID-19 infection please follow these guidelines to prevent infection:  Follow healthcare provider's  instructions Make sure that you understand and can help the patient follow any healthcare provider instructions for all care.  Provide for the patient's basic needs You should help the patient with basic needs in the home and provide support for getting groceries, prescriptions, and other personal needs.  Monitor the patient's symptoms If they are getting sicker, call his or her medical provider and tell them that the patient has, or is being evaluated for, COVID-19 infection. This will help the healthcare provider's office take steps to keep other people from getting infected. Ask the healthcare provider to call the local or state health department.  Limit the number of people who have contact with the patient  If possible, have only one caregiver for the patient.  Other household members should stay in another home or place of residence. If this is not possible, they should stay  in another room, or be separated from the patient as much as possible. Use a separate bathroom, if available.  Restrict visitors who do not have an essential need to be in the home.  Keep older adults, very young children, and other sick people away from the patient Keep older adults, very young children, and those who have compromised immune systems or chronic health conditions away from the patient. This includes people with chronic heart, lung, or kidney conditions, diabetes, and cancer.  Ensure good ventilation Make sure that shared spaces in the home have good air flow, such as from an air conditioner or an opened window, weather permitting.  Wash your hands often  Wash your hands often and thoroughly with soap and water for at least 20 seconds. You can use an alcohol based hand sanitizer if soap and water are not available and if your hands are not visibly dirty.  Avoid touching your eyes, nose, and mouth with unwashed hands.  Use disposable paper towels to dry your hands. If not available, use  dedicated cloth towels and replace them when they become wet.  Wear a facemask and gloves  Wear a disposable facemask at all times in the room and gloves when you touch or have contact with the patient's blood, body fluids, and/or secretions or excretions, such as sweat, saliva, sputum, nasal mucus, vomit, urine, or feces.  Ensure the mask fits over your nose and mouth tightly, and do not touch it during use.  Throw out disposable facemasks and gloves after using them. Do not reuse.  Wash your hands immediately after removing your facemask and gloves.  If your personal clothing becomes contaminated, carefully remove clothing and launder. Wash your hands after handling contaminated clothing.  Place all used disposable facemasks, gloves, and other waste in a lined container before disposing them with other household waste.  Remove gloves and wash your hands immediately after handling these items.  Do not share dishes, glasses, or other household items with the patient  Avoid sharing household items. You should not share dishes, drinking glasses, cups, eating utensils, towels, bedding, or other items with a patient who is confirmed to have, or being evaluated for, COVID-19 infection.  After the person uses these items,  you should wash them thoroughly with soap and water.  Wash laundry thoroughly  Immediately remove and wash clothes or bedding that have blood, body fluids, and/or secretions or excretions, such as sweat, saliva, sputum, nasal mucus, vomit, urine, or feces, on them.  Wear gloves when handling laundry from the patient.  Read and follow directions on labels of laundry or clothing items and detergent. In general, wash and dry with the warmest temperatures recommended on the label.  Clean all areas the individual has used often  Clean all touchable surfaces, such as counters, tabletops, doorknobs, bathroom fixtures, toilets, phones, keyboards, tablets, and bedside tables, every  day. Also, clean any surfaces that may have blood, body fluids, and/or secretions or excretions on them.  Wear gloves when cleaning surfaces the patient has come in contact with.  Use a diluted bleach solution (e.g., dilute bleach with 1 part bleach and 10 parts water) or a household disinfectant with a label that says EPA-registered for coronaviruses. To make a bleach solution at home, add 1 tablespoon of bleach to 1 quart (4 cups) of water. For a larger supply, add  cup of bleach to 1 gallon (16 cups) of water.  Read labels of cleaning products and follow recommendations provided on product labels. Labels contain instructions for safe and effective use of the cleaning product including precautions you should take when applying the product, such as wearing gloves or eye protection and making sure you have good ventilation during use of the product.  Remove gloves and wash hands immediately after cleaning.  Monitor yourself for signs and symptoms of illness Caregivers and household members are considered close contacts, should monitor their health, and will be asked to limit movement outside of the home to the extent possible. Follow the monitoring steps for close contacts listed on the symptom monitoring form.   ? If you have additional questions, contact your local health department or call the epidemiologist on call at [phone] (available 24/7). ? This guidance is subject to change. For the most up-to-date guidance from [facility], please refer to their website: [email or web address]

## 2020-02-23 NOTE — Assessment & Plan Note (Signed)
Patient is interested in stopping smoking today. She states she was able to stop with Wellbutrin and Chantix given simultaneously in the past. She was recently prescribed Wellbutrin, although did not pick up her prescription.   - Will prescribe Wellbutrin SR 150mg  x 3 days then BID x 12 weeks total  - Continue to counsel on cessation

## 2020-02-23 NOTE — Assessment & Plan Note (Signed)
Patient has pea-sized, punctate erythematous lesions without purulence, surrounding erythema, or drainage with scabbing in various stages of healing throughout majority of upper half of body. Believes lesions are secondary to mites she believes are in her car. She denies any drug use or history of picking her skin or at these lesions. Patient endorses good response to permethrin and metronidazole cream in the past although there are no visible mites or other bugs on examination today and lesions are not in crevices or moist areas without ring-enhancement to suggest scabies or fungal infection. May represent contact dermatitis.   - Will try 1% hydrocortisone cream - Consider referral to allergy / immunology if no significant response

## 2020-02-23 NOTE — Assessment & Plan Note (Signed)
Patient endorses ear pain, both of the internal ear and surrounding pinna, over the past 2 days. She endorses history of the same and was noted to have perforation on previous exam last July. She was referred to ENT but had not followed up. On exam, she has purulence and bulging of the right TM with tenderness upon movement of the pinna suggestive of purulent R OM, although no canal erythema or drainage to suggest acute otitis externa.   - Will give Augmentin 875mg  twice daily x 7 days  - Consider ENT referral if symptoms do not improve at follow up visit

## 2020-02-23 NOTE — Assessment & Plan Note (Signed)
Patient endorses worsening right arm weakness with decreased grip strength and frequently dropping things more lately. 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 Does not take Tylenol due to concerns regarding prior HCV although viral load was previously undetectable.   - Will require follow up at future visit

## 2020-02-23 NOTE — Assessment & Plan Note (Signed)
Patient endorses 2 days of headache, sore throat, right ear pain, pain behind her eyes, mild productive cough and heat intolerance. Denies fevers, chills, shortness or breath, nausea or changes in bowel habits, or inability to tolerate PO intake. Her grand-daughter that she was around 2 nights ago tested positive for COVID-19 yesterday. Patient is not vaccinated and denies interest in future vaccination currently.   - Will check COVID-19 and Flu A/B PCR  - Recommend quarantine for at least 10 days (until 03/03/20) or sooner if COVID-19 negative - Recommendations given as to when to seek emergency care - Follow up visit pending COVID test results

## 2020-02-24 NOTE — Addendum Note (Signed)
Addended by: Dorie Rank E on: 02/24/2020 12:09 PM   Modules accepted: Orders

## 2020-02-26 ENCOUNTER — Telehealth: Payer: Self-pay

## 2020-02-26 ENCOUNTER — Telehealth: Payer: Self-pay | Admitting: Student

## 2020-02-26 LAB — COVID-19, FLU A+B NAA
Influenza A, NAA: NOT DETECTED
Influenza B, NAA: NOT DETECTED
SARS-CoV-2, NAA: NOT DETECTED

## 2020-02-26 NOTE — Telephone Encounter (Signed)
Left voicemail for patient informing her that her COVID-19 and influenza testing was negative and that she does not need to quarantine from others, but likely has another viral illness that should self-resolve over the next several weeks.   Also informed her her urine showed no signs of infection but should continue ABX for her likely ear infection and informed her to try stopping cigarette use 1 week after starting Wellbutrin for best success.   Informed to return to [facility] if persistent symptoms, fevers, chills, or inability to tolerate PO or any other concerns or else follow up with PCP March 2022.   Glenford Bayley, PGY1 02/26/2020, 3:03 PM Pager: [phone]

## 2020-02-26 NOTE — Telephone Encounter (Signed)
Pt is calling regarding results [phone]

## 2020-02-27 NOTE — Telephone Encounter (Signed)
Attempted to call patient regarding results. No answer at this time. If patient returns call, advise that her COVID results are negative. If symptoms are persistent, she may schedule an appointment for further evaluation

## 2020-03-01 NOTE — Progress Notes (Signed)
Internal Medicine Clinic Attending  Case discussed with Dr. Speakman  At the time of the visit.  We reviewed the resident's history and exam and pertinent patient test results.  I agree with the assessment, diagnosis, and plan of care documented in the resident's note.  

## 2020-07-01 ENCOUNTER — Telehealth: Payer: Self-pay | Admitting: Student

## 2020-07-01 ENCOUNTER — Ambulatory Visit (INDEPENDENT_AMBULATORY_CARE_PROVIDER_SITE_OTHER): Payer: Self-pay | Admitting: Internal Medicine

## 2020-07-01 ENCOUNTER — Encounter: Payer: Self-pay | Admitting: Internal Medicine

## 2020-07-01 ENCOUNTER — Other Ambulatory Visit: Payer: Self-pay

## 2020-07-01 DIAGNOSIS — B880 Other acariasis: Secondary | ICD-10-CM

## 2020-07-01 DIAGNOSIS — L989 Disorder of the skin and subcutaneous tissue, unspecified: Secondary | ICD-10-CM

## 2020-07-01 MED ORDER — PREDNISONE 10 MG (21) PO TBPK: ORAL_TABLET | ORAL | 0 refills | Status: DC

## 2020-07-01 MED ORDER — HYDROXYZINE HCL 25 MG PO TABS: 25.0000 mg | ORAL_TABLET | Freq: Three times a day (TID) | ORAL | 0 refills | Status: DC | PRN

## 2020-07-01 NOTE — Progress Notes (Signed)
.    [facility] Internal Medicine Residency Telephone Encounter Continuity Care Appointment  HPI:   This telephone encounter was created for [name] on 07/01/2020 for the following purpose/cc rash.   Past Medical History:  Past Medical History:  Diagnosis Date  . Anxiety and depression   . History of hepatitis C       ROS:  Review of Systems  Constitutional: Negative for chills and fever.  Musculoskeletal: Negative for joint pain and myalgias.  Skin: Positive for itching and rash.       Assessment / Plan / Recommendations:   Please see A&P under problem oriented charting for assessment of the patient's acute and chronic medical conditions.   As always, pt is advised that if symptoms worsen or new symptoms arise, they should go to an urgent care facility or to to ER for further evaluation.   Consent and Medical Decision Making:   Patient discussed with Dr. Heide Spark  This is a telephone encounter between [name] and [name] on 07/01/2020 for chigger bites. The visit was conducted with the patient located at home and [name] at [facility]. The patient's identity was confirmed using their DOB and current address. The patient has consented to being evaluated through a telephone encounter and understands the associated risks (an examination cannot be done and the patient may need to come in for an appointment) / benefits (allows the patient to remain at home, decreasing exposure to coronavirus). I personally spent 21 minutes on medical discussion.

## 2020-07-01 NOTE — Assessment & Plan Note (Signed)
Is evaluated over telephone.  She continues to have a rash which was evaluated by Dr. Laddie Aquas in January.  The rash did improve with hydrocortisone cream, but has returned.  The rash is pinpoint macules without purulence, erythematous base, and pruritic.  The rash is distributed on arms, legs, torso, and head.  Considered dermatitis, but patient says her grandchildren has also had this rash at a lesser extent.  She has not seen any mites or fleas, which makes me think this is a reaction from bites and not a insect living on the patient.Also noted not to be in creases of body.  [place] this is started when she bought a 5th wheel trailer she lives in. Given systemic involvement I will prescribe a oral steroid with taper.  Gave recommendation to wash all clothing and bedding with hot water.  Prescribed hydroxyzine to help with itching.  Discussed using calamine if needed.  If patient does not improve she will call for in person visit for better evaluation.

## 2020-07-01 NOTE — Telephone Encounter (Signed)
Returned call to patient. She is requesting refill on metronidazole cream for bites on head, arms, and hands. States she has a cat with mites all over it but has been keeping it outside. Also, has newborn baby that she does not want to give mites to. States this has happened in the past but this is time is not as bad. She has been taking showers and rubbing epsom salt on bites. Placed on Dr. Mallie Mussel schedule for tele visit.

## 2020-07-01 NOTE — Telephone Encounter (Signed)
Would like refill on a cream she was prescribed before for scabies. Do not see it on her medication list. Patient requesting call back.

## 2020-07-04 NOTE — Progress Notes (Signed)
Internal Medicine Clinic Attending  Case discussed with Dr. Steen  At the time of the visit.  We reviewed the resident's history and exam and pertinent patient test results.  I agree with the assessment, diagnosis, and plan of care documented in the resident's note.  

## 2020-10-26 ENCOUNTER — Other Ambulatory Visit: Payer: Self-pay

## 2020-10-26 ENCOUNTER — Ambulatory Visit (INDEPENDENT_AMBULATORY_CARE_PROVIDER_SITE_OTHER): Payer: Self-pay | Admitting: Internal Medicine

## 2020-10-26 ENCOUNTER — Encounter: Payer: Self-pay | Admitting: Internal Medicine

## 2020-10-26 VITALS — BP 125/83 | HR 65 | Temp 97.9°F | Resp 24 | Ht 62.0 in | Wt 126.8 lb

## 2020-10-26 DIAGNOSIS — R5383 Other fatigue: Secondary | ICD-10-CM

## 2020-10-26 DIAGNOSIS — M545 Low back pain, unspecified: Secondary | ICD-10-CM | POA: Insufficient documentation

## 2020-10-26 DIAGNOSIS — Z131 Encounter for screening for diabetes mellitus: Secondary | ICD-10-CM

## 2020-10-26 DIAGNOSIS — M79672 Pain in left foot: Secondary | ICD-10-CM

## 2020-10-26 DIAGNOSIS — Z720 Tobacco use: Secondary | ICD-10-CM

## 2020-10-26 DIAGNOSIS — Z20822 Contact with and (suspected) exposure to covid-19: Secondary | ICD-10-CM

## 2020-10-26 DIAGNOSIS — M79671 Pain in right foot: Secondary | ICD-10-CM | POA: Insufficient documentation

## 2020-10-26 DIAGNOSIS — M722 Plantar fascial fibromatosis: Secondary | ICD-10-CM

## 2020-10-26 DIAGNOSIS — R059 Cough, unspecified: Secondary | ICD-10-CM | POA: Insufficient documentation

## 2020-10-26 LAB — POCT GLYCOSYLATED HEMOGLOBIN (HGB A1C): Hemoglobin A1C: 5.6 % (ref 4.0–5.6)

## 2020-10-26 LAB — GLUCOSE, CAPILLARY: Glucose-Capillary: 92 mg/dL (ref 70–99)

## 2020-10-26 MED ORDER — BUPROPION HCL ER (XL) 150 MG PO TB24: 150.0000 mg | ORAL_TABLET | Freq: Every day | ORAL | 0 refills | Status: DC

## 2020-10-26 MED ORDER — BUPROPION HCL ER (SR) 150 MG PO TB12: 150.0000 mg | ORAL_TABLET | Freq: Two times a day (BID) | ORAL | 0 refills | Status: DC

## 2020-10-26 NOTE — Assessment & Plan Note (Signed)
Patient presents with symptoms of bilateral foot pain over the past month.  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  She states that her pain is currently a 7 out of 10, affecting just the soles of her feet.  The pain is described as "burning" and occasionally like "somebody is hitting the bottoms of my feet."  He states that aggravating factors are walking and running, but her pain is especially aggravated first thing in the morning when she gets out of bed.  She states that resting at night alleviates the pain, but if she sleeps on both of her feet together the pain exacerbated.  She has tried ibuprofen and Aleve but these are minimally beneficial.  Assessment: Patient presents with bilateral foot pain for the past month.  On physical examination, patient has high arches bilaterally, she is tender to palpation along the soles of her feet starting from her heel.  On ambulation patient appears in mild distress, and walking on the sides of her feet.  Given her prior history of dancing including tap dance, ballet, jazz dance and aggravating pain that is especially worse in the mornings, it appears her findings are consistent with plantar fasciitis.  We discussed stretches and referral to podiatry for further recommendations. - Attached sheet on Planter fasciitis and exercises - Podiatry referral, may need inserts

## 2020-10-26 NOTE — Assessment & Plan Note (Signed)
Patient presents with complaints of back pain for the past 4 to 6 weeks. She states that it is present bilaterally in the lower back with feelings of tightness down her legs and terminates at the knees bilaterally.  She has tried Aleve, and warm baths but it does not alleviate her pain.  Her pain is described as constant.  She is concerned for back pain with sciatica.  Assessment: Patient presents with 4 to 6 weeks of acute lower back pain bilaterally.  On physical examination there were no step-off lesions, no complaints or signs of red flags for cord compression.  Straight leg test is negative for sciatica. When patient ambulates, she avoids walking on the soles of her feet, her pain is likely secondary to her bilateral foot pain. She has tried some conservative measures including Aleve and warm baths in a Jacuzzi but these are minimally helpful. Discussed how often she is holding her new baby, but she endorses that her grandmother does most of the holding. Discussed her exam findings and her consistent with musculoskeletal pain, and recommend physical therapy for her lower back pain.  Patient is with agreement. Hopefully, by addressing her bilateral foot pain her back pain will improve. - Referral to physical therapy for acute lower musculoskeletal back pain - Continue conservative management - Follow-up in 6 weeks

## 2020-10-26 NOTE — Assessment & Plan Note (Addendum)
Patient endorses a dry nonproductive cough for about a week after being exposed to a coworker who was positive for COVID. She denies loss of taste or smell, having regular bowel movements, and denies any shortness of breath, wheezing, dyspnea.  During our encounter there were no coughing fits, she is able to speak in full sentences and does not appear in any acute distress, and is saturating 99% on room air.  Given her description, this is likely a viral upper respiratory illness, but will COVID swab today to rule out COVID-19. - COVID swab today, will call patient back with results - Conservative management in the interim

## 2020-10-26 NOTE — Patient Instructions (Addendum)
To Ms. Kreuser,   It was a pleasure meeting you! Today we discussed your cough, foot pain, fatigue, and back pain.   For your cough, you did have a COVID exposure and we swabbed you. I will call you with the results, but they may take several days to come back.   For your foot pain, it appears you have Plantar fasciitis. I have attached some printouts and will refer you to podiatry for further interventions. I highly recommend stretching your feet as much as possible. I will also obtain blood work to rule out other conditions.   For your back pain, I would continue with ibuprofen and a heating pad. I will refer you to physical therapy as well.   For your fatigue, we will run blood work to rule out other conditions.   Have a good day and we will see you in 4-6 weeks.  Dolan Amen, MD

## 2020-10-26 NOTE — Assessment & Plan Note (Signed)
Patient presents with complaints of fatigue for several months. She endorses daytime sleepiness, requiring naps.  She does endorse having a new baby that she adopted in May of this year. She additionally endorses difficulty sleeping over the past month, secondary to bilateral foot pain (see bilateral foot pain).  Assessment: Patient with complaint of several months of fatigue, she does have some life stressors including adopting a new baby, and bilateral foot pain for a month in duration.  She has not been told that she snores, given her body habitus, OSA seems unlikely.  While her symptoms may largely contribute to the care of her new baby, will screen for secondary causes. - CBC - TSH - Vitamin D 25 - Folate - B12

## 2020-10-26 NOTE — Assessment & Plan Note (Addendum)
Patient presents with questions cessation.  She would like to quit smoking, and is asking for a smoking cessation aid.  She states that she was on Chantix and Wellbutrin in the past.  She did not pick up her Wellbutrin, and she suffered suicidal ideation on Chantix, with her partner coming in 1 night finding her writing a suicide note.  Her suicidal ideation stopped as soon as she quit Chantix. Given her side effects on Chantix, this medication should never be started again.  She has not picked up her previous prescriptions for Wellbutrin.  Will prescribe Wellbutrin 150 mg for 3 days followed by Wellbutrin 150 mg twice daily for 12 weeks. - Wellbutrin 150 mg daily for 3 days followed by Wellbutrin 150 mg twice daily for 12 weeks. - Follow-up on next medical visit.

## 2020-10-26 NOTE — Progress Notes (Signed)
   CC: foot pain  HPI:  Ms.Brecklyn Zucco is a 50 y.o. person, with a PMH noted below, who presents to the clinic for diabetes screening. To see the management of their acute and chronic conditions, please see the A&P note under the Encounters tab.   Past Medical History:  Diagnosis Date   Anxiety and depression    History of hepatitis C    Review of Systems:   Review of Systems  Constitutional:  Positive for malaise/fatigue. Negative for chills, diaphoresis, fever and weight loss.  HENT:  Negative for congestion and sinus pain.   Respiratory:  Positive for cough. Negative for hemoptysis, sputum production and shortness of breath.   Cardiovascular:  Negative for chest pain and palpitations.  Musculoskeletal:  Positive for back pain.       Pain in both feet    Physical Exam:  Vitals:   10/26/20 0906  BP: 125/83  Pulse: 65  Resp: (!) 24  Temp: 97.9 F (36.6 C)  TempSrc: Oral  SpO2: 99%  Weight: 126 lb 12.8 oz (57.5 kg)  Height: 5\' 2"  (1.575 m)   Physical Exam Vitals reviewed.  Constitutional:      General: She is not in acute distress.    Appearance: Normal appearance. She is normal weight. She is not ill-appearing or toxic-appearing.  HENT:     Head: Normocephalic.  Pulmonary:     Effort: Pulmonary effort is normal.     Breath sounds: Normal breath sounds.  Musculoskeletal:     Right lower leg: No edema.     Left lower leg: No edema.     Comments: Feet: Tenderness to palpation on the plantar aspect of the feet bilaterally.  High arches.  Sensation intact.  Negative straight leg test bilaterally.   Back: Tenderness to palpation in the paraspinal musculature of the lumbar region.  No tenderness, drop-off over the lumbar vertebral bodies.  Ambulation: Ambulates on the lateral aspect of her feet, when walking with normal gait appears to be in pain.  Skin:    General: Skin is warm.     Coloration: Skin is not jaundiced or pale.     Findings: No bruising, erythema or rash.   Neurological:     General: No focal deficit present.     Mental Status: She is alert and oriented to person, place, and time.  Psychiatric:        Mood and Affect: Mood normal.        Behavior: Behavior normal.     Assessment & Plan:   See Encounters Tab for problem based charting.  Patient discussed with Dr. 

## 2020-10-26 NOTE — Progress Notes (Signed)
Internal Medicine Clinic Attending ? ?Case discussed with Dr. Winters  At the time of the visit.  We reviewed the resident?s history and exam and pertinent patient test results.  I agree with the assessment, diagnosis, and plan of care documented in the resident?s note.  ?

## 2020-10-27 LAB — CBC
Hematocrit: 42.6 % (ref 34.0–46.6)
Hemoglobin: 14.5 g/dL (ref 11.1–15.9)
MCH: 29.4 pg (ref 26.6–33.0)
MCHC: 34 g/dL (ref 31.5–35.7)
MCV: 86 fL (ref 79–97)
Platelets: 258 10*3/uL (ref 150–450)
RBC: 4.93 x10E6/uL (ref 3.77–5.28)
RDW: 12.2 % (ref 11.7–15.4)
WBC: 6.6 10*3/uL (ref 3.4–10.8)

## 2020-10-27 LAB — TSH: TSH: 1.61 u[IU]/mL (ref 0.450–4.500)

## 2020-10-27 LAB — SARS-COV-2, NAA 2 DAY TAT

## 2020-10-27 LAB — VITAMIN D 25 HYDROXY (VIT D DEFICIENCY, FRACTURES): Vit D, 25-Hydroxy: 31 ng/mL (ref 30.0–100.0)

## 2020-10-27 LAB — NOVEL CORONAVIRUS, NAA: SARS-CoV-2, NAA: NOT DETECTED

## 2020-10-27 LAB — FOLATE RBC
Folate, Hemolysate: 341 ng/mL
Folate, RBC: 800 ng/mL (ref >498)

## 2020-10-27 LAB — VITAMIN B12: Vitamin B-12: 679 pg/mL (ref 232–1245)

## 2020-10-28 ENCOUNTER — Telehealth: Payer: Self-pay | Admitting: Student

## 2020-10-28 NOTE — Telephone Encounter (Signed)
Returned call to patient. No answer. Left message on VM requesting return call. L. Yoana Staib, BSN, RN-BC  ° °  ° °

## 2020-10-28 NOTE — Telephone Encounter (Signed)
Refill Request-Pt seen by Dr. Sande Brothers on 10/26/2020 and is still waiting for her medications listed below:      buPROPion (WELLBUTRIN SR) 150 MG 12 hr tablet buPROPion (WELLBUTRIN XL) 150 MG 24 hr tablet

## 2020-10-28 NOTE — Telephone Encounter (Signed)
Patient called in requesting refill on wellbutin as well as Rxs for chantix and flexeril at [facility] in [place].  States these meds were discussed at OV on 9/14.  2 different doses of Wellbutrin on med list and both were set to Print mode

## 2020-10-31 NOTE — Telephone Encounter (Signed)
Patient returned call on 9/16. Relayed info from Dr. Sande Brothers below.

## 2020-11-03 ENCOUNTER — Telehealth: Payer: Self-pay

## 2020-11-03 NOTE — Telephone Encounter (Signed)
Requesting lab results, please call pt back.  

## 2020-11-03 NOTE — Telephone Encounter (Signed)
Pt is calling again about her results  

## 2021-02-21 ENCOUNTER — Telehealth: Payer: Self-pay | Admitting: Student

## 2021-02-21 NOTE — Telephone Encounter (Signed)
Pt requesting a call back.  Patient believes she has bug bites all over .  Patient is itching all over.

## 2021-02-21 NOTE — Telephone Encounter (Signed)
Return call to pt. Stated she may have bug bites.stated she has red areas on her back/legs/buttocks x 1-2 weeks. Stated she can feel something biting her underneath her skin. And they itch; after she scratches, a scab develops. She has dogs and cats - she has been washing her bedding. Appt schedule with Dr Quincy Simmonds at 1415 PM tomorrow 02/23/20.

## 2021-02-22 ENCOUNTER — Other Ambulatory Visit: Payer: Self-pay

## 2021-02-22 ENCOUNTER — Encounter: Payer: Self-pay | Admitting: Internal Medicine

## 2021-02-22 ENCOUNTER — Ambulatory Visit (INDEPENDENT_AMBULATORY_CARE_PROVIDER_SITE_OTHER): Payer: Self-pay | Admitting: Internal Medicine

## 2021-02-22 DIAGNOSIS — Z720 Tobacco use: Secondary | ICD-10-CM

## 2021-02-22 DIAGNOSIS — L989 Disorder of the skin and subcutaneous tissue, unspecified: Secondary | ICD-10-CM

## 2021-02-22 DIAGNOSIS — F419 Anxiety disorder, unspecified: Secondary | ICD-10-CM

## 2021-02-22 DIAGNOSIS — F32A Depression, unspecified: Secondary | ICD-10-CM

## 2021-02-22 NOTE — Assessment & Plan Note (Signed)
Patient reports improvement in mood following adopting new family member, 61-month-old boy.  Went through DSS.  Is currently undergoing adoption process.  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  Patient reports the baby is "the light of her life", is also caring for 3-4 grandchildren.  Has fatigue associated with child caregiving, though on exam has a cheerful affect and very affectionate and attentive with baby boy.  Discussed concerns about secondhand smoke.  67-month-old boy is present in the room during OV, easily consolable, appears healthy, interacting with environment.

## 2021-02-22 NOTE — Patient Instructions (Signed)
Thank you, [name] for allowing [place] to provide your care today. Today we discussed:  Itchy bug bites: Keep a look out for other than mild or bedbugs that might be causing the symptoms.  For the itchiness you can use chronic cream or Benadryl cream.  Try not to use alcohol on your skin as this can be very drying.  You can use Aveeno lotion to help with healing.  I think it is reasonable to let your landlord know that there might be a bedbug or other bug problem in the home.  My Chart Access: [email or web address]  Please follow-up in 1 month for routine visit.  Please make sure to arrive 15 minutes prior to your next appointment. If you arrive late, you may be asked to reschedule.    We look forward to seeing you next time. Please call our clinic at [phone] if you have any questions or concerns. The best time to call is Monday-Friday from 9am-4pm, but there is someone available 24/7. If after hours or the weekend, call the main hospital number and ask for the Internal Medicine Resident On-Call. If you need medication refills, please notify your pharmacy one week in advance and they will send [place] a request.   Thank you for letting us take part in your care. Wishing you the best!  Ellison Carwin, MD 02/22/2021, 4:03 PM IM Resident, PGY-1

## 2021-02-22 NOTE — Assessment & Plan Note (Addendum)
Patient presents for new complaint of pruritic insect bites.  Patient reports that these bites are different than prior which she thinks were chigger bites.  Reports previous bites self resolved.  Patient believes her new bites are due to mites or fleas that are present on the stray cats that live around her property.  These cats apparently enter her [place]iece through a broken window and sleep in the [place].  Patient sprayed the van with flea and termite treatment and had resolution of symptoms, though cats come back into the car and she has recurrence of symptoms.  She has 3 children who also have bug bites though to a lesser extent.  New 65-month old baby does not have bites.  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  Patient denies new upholstery, mattresses, couch that was moved into the home.  Does have visitors who have left clothing in the home.  Has been using rubbing alcohol on her skin after coming home from work or after using her [employer] Niece for transport.  Has not used anything else for itching.  Has been taking frequent showers, washing her clothes and sheets consistently.  On exam, patient has multiple excoriated 1 cm erythematous macules with evidence of many healed and scarred lesions of similar size present on all 4 extremities, torso, buttocks and one on her forehead.  Photos provided in media tab.  On assessment, given that patient and children are both affected more likely these are insect bites, possibly bedbug bites given the size and distribution.  Patient seems convinced that these are mites given improvement in symptoms after not being in the [place] or spraying the [place].  Discussed that patient can use over-the-counter creams for itching and to help the excoriations heal.  Discussed it would be best to let her landlord know there may be an insect problem in the home.  Encouraged patient to find and trap the mite or bedbug for physical evidence.  Patient will be  spray car now that she has taped up the window and cats can no longer entered the [place].  Also strongly considered diagnosis of excoriation disorder also known as psychogenic excoriation given patient's history similar skin lesions, hx of depression and anxiety and history of substance abuse.  Though given patient's home environment, it seems that patient has reasons she would be more likely to have insect bites, and other family members affected.  Plan: -Benadryl cream, Sarna cream, Aveeno cream for symptoms -Respray car, continue to wash clothing/sheets, frequent showers -Do not use rubbing alcohol on skin -Alert landlord of possible insect problem, may require fumigation of home -Monitor for recurrence of symptoms without inciting incident, take photos to document similarity of skin lesions
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
## 2021-02-22 NOTE — Assessment & Plan Note (Signed)
Currently smoking 1/2 pack/day, only outdoors given young children in the home.  Consider addressing TUD at next OV.  Patient encouraged to attend routine health visit in 1 month.

## 2021-03-01 NOTE — Progress Notes (Signed)
Internal Medicine Clinic Attending ? ?Case discussed with Dr. Zinoviev  At the time of the visit.  We reviewed the resident?s history and exam and pertinent patient test results.  I agree with the assessment, diagnosis, and plan of care documented in the resident?s note.  ?

## 2021-04-20 ENCOUNTER — Encounter: Payer: Medicaid Other | Admitting: Student

## 2021-05-11 ENCOUNTER — Other Ambulatory Visit (HOSPITAL_COMMUNITY)
Admission: RE | Admit: 2021-05-11 | Discharge: 2021-05-11 | Disposition: A | Discharge status: Home / Self Care | Payer: Medicaid Other | Source: Ambulatory Visit | Attending: Internal Medicine | Admitting: Internal Medicine

## 2021-05-11 ENCOUNTER — Ambulatory Visit (INDEPENDENT_AMBULATORY_CARE_PROVIDER_SITE_OTHER): Payer: Self-pay | Admitting: Student

## 2021-05-11 ENCOUNTER — Encounter: Payer: Self-pay | Admitting: Student

## 2021-05-11 VITALS — BP 123/91 | HR 62 | Temp 97.8°F | Ht 62.0 in | Wt 133.2 lb

## 2021-05-11 DIAGNOSIS — Z0000 Encounter for general adult medical examination without abnormal findings: Secondary | ICD-10-CM

## 2021-05-11 DIAGNOSIS — Z8619 Personal history of other infectious and parasitic diseases: Secondary | ICD-10-CM

## 2021-05-11 DIAGNOSIS — Z113 Encounter for screening for infections with a predominantly sexual mode of transmission: Secondary | ICD-10-CM

## 2021-05-11 DIAGNOSIS — Z124 Encounter for screening for malignant neoplasm of cervix: Secondary | ICD-10-CM | POA: Diagnosis not present

## 2021-05-11 DIAGNOSIS — R21 Rash and other nonspecific skin eruption: Secondary | ICD-10-CM

## 2021-05-11 DIAGNOSIS — L989 Disorder of the skin and subcutaneous tissue, unspecified: Secondary | ICD-10-CM

## 2021-05-11 MED ORDER — HYDROCORTISONE 1 % EX CREA: TOPICAL_CREAM | CUTANEOUS | 1 refills | Status: AC

## 2021-05-11 NOTE — Patient Instructions (Signed)
[name], it was a pleasure seeing you today! ? ?Today we discussed: ?- I have given you steroid cream to help the itching. I have referred you to a dermatologist. I want to see if there is anything else we are missing. ? ?- I will call you with the HIV test. ? ?I have ordered the following labs today: ? ? ?Lab Orders    ?     HIV antibody (with reflex)     ? ? ?Referrals ordered today:  ? ? ?Referral Orders    ?     Ambulatory referral to Dermatology     ? ?I have ordered the following medication/changed the following medications:  ? ?Start the following medications: ?Meds ordered this encounter  ?Medications  ? hydrocortisone cream 1 %  ?  Sig: Apply to affected area 2 times daily  ?  Dispense:  30 g  ?  Refill:  1  ?  ? ?Follow-up: 6 months  ? ?Please make sure to arrive 15 minutes prior to your next appointment. If you arrive late, you may be asked to reschedule.  ? ?We look forward to seeing you next time. Please call our clinic at [phone] if you have any questions or concerns. The best time to call is Monday-Friday from 9am-4pm, but there is someone available 24/7. If after hours or the weekend, call the main hospital number and ask for the Internal Medicine Resident On-Call. If you need medication refills, please notify your pharmacy one week in advance and they will send [place] a request. ? ?Thank you for letting us take part in your care. Wishing you the best! ? ?Thank you, ?Evlyn Kanner, MD ? ?

## 2021-05-12 DIAGNOSIS — Z0000 Encounter for general adult medical examination without abnormal findings: Secondary | ICD-10-CM | POA: Insufficient documentation

## 2021-05-12 DIAGNOSIS — Z139 Encounter for screening, unspecified: Secondary | ICD-10-CM | POA: Insufficient documentation

## 2021-05-12 DIAGNOSIS — Z113 Encounter for screening for infections with a predominantly sexual mode of transmission: Secondary | ICD-10-CM | POA: Insufficient documentation

## 2021-05-12 LAB — HEPATIC FUNCTION PANEL
ALT: 11 IU/L (ref 0–32)
AST: 13 IU/L (ref 0–40)
Albumin: 3.9 g/dL (ref 3.8–4.8)
Alkaline Phosphatase: 80 IU/L (ref 44–121)
Bilirubin Total: <0.2 mg/dL (ref 0.0–1.2)
Bilirubin, Direct: <0.1 mg/dL (ref 0.00–0.40)
Total Protein: 6.6 g/dL (ref 6.0–8.5)

## 2021-05-12 LAB — HIV ANTIBODY (ROUTINE TESTING W REFLEX): HIV Screen 4th Generation wRfx: NONREACTIVE

## 2021-05-12 NOTE — Assessment & Plan Note (Signed)
Ms. Ruest is returning today requesting to check hepatitis C levels. She has not had any abdominal pain, nausea, vomiting, or new blood exposures. I explained to Ms. Moone that her labs in 2018 revealed that her infection was cleared and that further monitoring of levels was not necessary. She was very anxious about this and continued to request repeating levels. I discussed with her that we can check liver function today and if abnormal we can work this up. She agreed to plan. ? ?- Follow-up LFT's ?
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
## 2021-05-12 NOTE — Assessment & Plan Note (Signed)
PAP smear today.  

## 2021-05-12 NOTE — Progress Notes (Signed)
? ?CC: rash ? ?HPI: ? ?[name] is a 51 y.o. person with medical history as below presenting to [facility] for rash. ? ?Please see problem-based list for further details, assessments, and plans. ? ?Past Medical History:  ?Diagnosis Date  ? Anxiety and depression   ? History of hepatitis C   ? ?Review of Systems:  As per HPI ? ?Physical Exam: ? ?Vitals:  ? 05/11/21 1559  ?BP: (!) 123/91  ?Pulse: 62  ?Temp: 97.8 ?F (36.6 ?C)  ?TempSrc: Oral  ?SpO2: 99%  ?Weight: 133 lb 3.2 oz (60.4 kg)  ?Height: 5\' 2"  (1.575 m)  ? ?General: Resting comfortably in no acute distress ?CV: Regular rate, rhythm. No murmurs appreciated. Warm extremities. ?Pulm: Normal respiratory effort on room air. Clear to ausculation bilaterally. ?GU: Normal external genitalia. Vaginal canal and cervix without any lesions or signs of inflammation. Small amount of white discharge in vault. ?Skin: Small, 76mm macules throughout face, arm, trunk, and legs. In various stages of healing, including open without bleeding, scabbed over, and scar-like. No lesions in folds of skin. No signs of tracking.  ?Neuro: Awake, alert, conversing appropriately. ?Psych: Normal mood, affect, speech. ? ?Assessment & Plan:  ? ?Routine screening for STI (sexually transmitted infection) ?[name] reports one recent sexual encounter with a new partner without the use of a condom. She denies any fevers, chills, body aches, vaginal discharge, genital lesions.  ? ?- GC/chlamydia, trich, BV, HIV pending ? ?Skin lesions, generalized ?[name] is returning to clinic today to discuss recurring pruritic bites throughout her body. She has now been dealing with this since September 2021 without adequate relief. She says she is the only one in her house with these lesions, although her children previously did have these. Mentions their rash cleared up with permethrin cream. Other creams have not relieved her symptoms. However, she did use permethrin cream at one time with improvement of her  symptoms, although never totally cleared up. These lesions are not painful and have not had drainage. She does not have any systemic symptoms or rashes consistent with arthropod or tick bites. She notes that she was previously told this was bedbugs, however she stated there are no bedbugs in her house. [place]. She says she now lives in a new place and has not noticed any similar issues. [name] believes her symptoms are due to mites and is requesting permethrin cream today. ? ?On examination, there are many small macules in different stages of healing. While these sites do look similar to a mite bite, the fact these have been occurring repeatedly without relief is concerning for possible psychogenic causes. In lieu of trialing a different medication, I discussed with Ms. Hedglin going to a dermatologist for further evaluation. We will also prescribe hydrocortisone cream for symptomatic relief. ? ?- Referral dermatologist ?- Hydrocortisone cream for symptoms ? ?History of hepatitis C ?[name] is returning today requesting to check hepatitis C levels. She has not had any abdominal pain, nausea, vomiting, or new blood exposures. I explained to [name] that her labs in 2018 revealed that her infection was cleared and that further monitoring of levels was not necessary. She was very anxious about this and continued to request repeating levels. I discussed with her that we can check liver function today and if abnormal we can work this up. She agreed to plan. ? ?- Follow-up LFT's ? ?  Healthcare maintenance ?- PAP smear today ? ? ?Patient discussed with Dr.  Sol Blazing ? ?Evlyn Kanner, MD ?Internal Medicine PGY-2 ?Pager: [phone] ? ?

## 2021-05-12 NOTE — Assessment & Plan Note (Addendum)
Ms. Fergeson is returning to clinic today to discuss recurring pruritic bites throughout her body. She has now been dealing with this since September 2021 without adequate relief. She says she is the only one in her house with these lesions, although her children previously did have these. Mentions their rash cleared up with permethrin cream. Other creams have not relieved her symptoms. However, she did use permethrin cream at one time with improvement of her symptoms, although never totally cleared up. These lesions are not painful and have not had drainage. She does not have any systemic symptoms or rashes consistent with arthropod or tick bites. She notes that she was previously told this was bedbugs, however she stated there are no bedbugs in her house. [place]. She says she now lives in a new place and has not noticed any similar issues. Ms. Baldonado believes her symptoms are due to mites and is requesting permethrin cream today. ? ?On examination, there are many small macules in different stages of healing. While these sites do look similar to a mite bite, the fact these have been occurring repeatedly without relief is concerning for possible psychogenic causes. In lieu of trialing a different medication, I discussed with Ms. Lofaso going to a dermatologist for further evaluation. We will also prescribe hydrocortisone cream for symptomatic relief. ? ?- Referral dermatologist ?- Hydrocortisone cream for symptoms ?

## 2021-05-15 NOTE — Progress Notes (Signed)
Internal Medicine Clinic Attending ? ?Case discussed with Dr. Braswell  At the time of the visit.  We reviewed the resident?s history and exam and pertinent patient test results.  I agree with the assessment, diagnosis, and plan of care documented in the resident?s note.  ?

## 2021-05-17 LAB — CERVICOVAGINAL ANCILLARY ONLY
Bacterial Vaginitis (gardnerella): POSITIVE — AB
Chlamydia: NEGATIVE
Comment: NEGATIVE
Comment: NEGATIVE
Comment: NEGATIVE
Comment: NORMAL
Neisseria Gonorrhea: NEGATIVE
Trichomonas: NEGATIVE

## 2021-05-17 LAB — CYTOLOGY - PAP
Comment: NEGATIVE
Diagnosis: NEGATIVE
High risk HPV: NEGATIVE

## 2021-05-23 ENCOUNTER — Telehealth: Payer: Self-pay

## 2021-05-23 NOTE — Telephone Encounter (Signed)
Pt is requesting a call back .. she is wanting to know about her test results  ?

## 2021-05-24 ENCOUNTER — Other Ambulatory Visit: Payer: Self-pay | Admitting: Student

## 2021-05-24 DIAGNOSIS — N760 Acute vaginitis: Secondary | ICD-10-CM

## 2021-05-24 MED ORDER — METRONIDAZOLE 500 MG PO TABS: 500.0000 mg | ORAL_TABLET | Freq: Two times a day (BID) | ORAL | 0 refills | Status: DC

## 2021-05-24 MED ORDER — METRONIDAZOLE 500 MG PO TABS
500.0000 mg | ORAL_TABLET | Freq: Two times a day (BID) | ORAL | 0 refills | Status: AC
Start: 2021-05-24 — End: 2021-05-31

## 2021-05-24 NOTE — Addendum Note (Signed)
Addended byEvlyn Kanner on: 05/24/2021 04:47 PM ? ? Modules accepted: Orders ? ?

## 2021-06-19 ENCOUNTER — Telehealth: Payer: Self-pay

## 2021-06-19 NOTE — Telephone Encounter (Signed)
Called pt again - no answer; mailbox full. ?

## 2021-06-19 NOTE — Telephone Encounter (Signed)
Return call to pt - no answer and mailbox is full. Unable to leave a message. ?

## 2021-06-19 NOTE — Telephone Encounter (Signed)
Pt is requesting a call back ... she is wanting to know if her PAP smear   results have come back as of yet  ?

## 2022-06-06 ENCOUNTER — Emergency Department (HOSPITAL_BASED_OUTPATIENT_CLINIC_OR_DEPARTMENT_OTHER)
Admission: EM | Admit: 2022-06-06 | Discharge: 2022-06-06 | Disposition: A | Discharge status: Home / Self Care | Payer: Medicaid Other | Attending: Emergency Medicine | Admitting: Emergency Medicine

## 2022-06-06 ENCOUNTER — Other Ambulatory Visit (HOSPITAL_BASED_OUTPATIENT_CLINIC_OR_DEPARTMENT_OTHER): Payer: Self-pay

## 2022-06-06 ENCOUNTER — Emergency Department (HOSPITAL_BASED_OUTPATIENT_CLINIC_OR_DEPARTMENT_OTHER): Payer: Medicaid Other

## 2022-06-06 ENCOUNTER — Other Ambulatory Visit: Payer: Self-pay

## 2022-06-06 ENCOUNTER — Encounter (HOSPITAL_BASED_OUTPATIENT_CLINIC_OR_DEPARTMENT_OTHER): Payer: Self-pay | Admitting: Emergency Medicine

## 2022-06-06 DIAGNOSIS — S99912A Unspecified injury of left ankle, initial encounter: Secondary | ICD-10-CM | POA: Diagnosis present

## 2022-06-06 DIAGNOSIS — S92152A Displaced avulsion fracture (chip fracture) of left talus, initial encounter for closed fracture: Secondary | ICD-10-CM | POA: Diagnosis not present

## 2022-06-06 DIAGNOSIS — S82832A Other fracture of upper and lower end of left fibula, initial encounter for closed fracture: Secondary | ICD-10-CM | POA: Insufficient documentation

## 2022-06-06 DIAGNOSIS — W010XXA Fall on same level from slipping, tripping and stumbling without subsequent striking against object, initial encounter: Secondary | ICD-10-CM | POA: Insufficient documentation

## 2022-06-06 DIAGNOSIS — S82892A Other fracture of left lower leg, initial encounter for closed fracture: Secondary | ICD-10-CM | POA: Diagnosis not present

## 2022-06-06 MED ORDER — NAPROXEN 500 MG PO TABS: 500.0000 mg | ORAL_TABLET | Freq: Two times a day (BID) | ORAL | 0 refills | Status: DC

## 2022-06-06 MED ORDER — OXYCODONE HCL 5 MG PO TABS
5.0000 mg | ORAL_TABLET | Freq: Once | ORAL | Status: AC
  Administered 2022-06-06: 5 mg via ORAL
  Filled 2022-06-06: qty 1

## 2022-06-06 MED ORDER — IBUPROFEN 400 MG PO TABS
600.0000 mg | ORAL_TABLET | Freq: Once | ORAL | Status: AC
  Administered 2022-06-06: 600 mg via ORAL
  Filled 2022-06-06: qty 1

## 2022-06-06 MED ORDER — OXYCODONE HCL 5 MG PO TABS
5.0000 mg | ORAL_TABLET | ORAL | 0 refills | Status: DC | PRN
  Filled 2022-06-06: qty 5, 1d supply, fill #0

## 2022-06-06 MED ORDER — OXYCODONE HCL 5 MG PO TABS: 5.0000 mg | ORAL_TABLET | ORAL | 0 refills | Status: DC | PRN

## 2022-06-06 NOTE — ED Triage Notes (Signed)
Pt sts she fell from standing position in her yard today; c/o LT ankle pain; swelling noted

## 2022-06-06 NOTE — Discharge Instructions (Addendum)
For left ankle injury.  You have an avulsion fracture.  You are given a boot and crutches.  Use the medication as needed for pain and follow-up with orthopedics.  Keep it elevated and use ice for about 15 minutes at a time several times a day. Come back for new or worsening symptoms.

## 2022-06-06 NOTE — ED Provider Notes (Signed)
[place] [facility] Provider Note   CSN: [id number] Arrival date & time: 06/06/22  1249     History  Chief Complaint  Patient presents with   Ankle Pain    [name] is a 52 y.o. female.  She presents ER complaining of left ankle pain.  States today she was walking outside and tripped, fell down and is complaining of pain and swelling to the left ankle.  Denies numbness or tingling, no foot leg or knee pain, it is localized to the lateral aspect of the left ankle.   Ankle Pain      Home Medications Prior to Admission medications   Medication Sig Start Date End Date Taking? Authorizing Provider  naproxen (NAPROSYN) 500 MG tablet Take 1 tablet (500 mg total) by mouth 2 (two) times daily. 06/06/22  Yes Deoni Cosey A, PA-C  oxyCODONE (ROXICODONE) 5 MG immediate release tablet Take 1 tablet (5 mg total) by mouth every 4 (four) hours as needed for severe pain. 06/06/22  Yes Mairim Bade A, PA-C  buPROPion (WELLBUTRIN SR) 150 MG 12 hr tablet Take 1 tablet (150 mg total) by mouth 2 (two) times daily. 10/30/20 01/22/21  Dolan Amen, MD  buPROPion (WELLBUTRIN XL) 150 MG 24 hr tablet Take 1 tablet (150 mg total) by mouth daily for 3 days. 10/26/20 10/29/20  Dolan Amen, MD  fluticasone (FLONASE) 50 MCG/ACT nasal spray Place 1 spray into both nostrils daily. 11/30/19   Caccavale, Sophia, PA-C  hydrOXYzine (ATARAX/VISTARIL) 25 MG tablet Take 1 tablet (25 mg total) by mouth every 8 (eight) hours as needed for itching. 07/01/20   Gardenia Phlegm, MD  predniSONE (STERAPRED UNI-PAK 21 TAB) 10 MG (21) TBPK tablet Day 1 take 6 tablets, Day 2 take 5 tablets, Day 3 take 4 tablets , Day 4 take 3 tablets, Day 5 take 2 tablets, Day 6 take 1 tablet 07/01/20   Gardenia Phlegm, MD      Allergies    Tylenol [acetaminophen]    Review of Systems   Review of Systems  Physical Exam Updated Vital Signs BP (!) 132/103 (BP Location: Right Arm)   Pulse 68   Temp  98.1 F (36.7 C)   Resp 18   Ht  (1.575 m)   Wt 59 kg   LMP  (LMP Unknown)   SpO2 98%   BMI 23.78 kg/m  Physical Exam Vitals and nursing note reviewed.  Constitutional:      General: She is not in acute distress.    Appearance: She is well-developed.  HENT:     Head: Normocephalic and atraumatic.  Eyes:     Conjunctiva/sclera: Conjunctivae normal.  Cardiovascular:     Rate and Rhythm: Normal rate and regular rhythm.     Heart sounds: No murmur heard. Pulmonary:     Effort: Pulmonary effort is normal. No respiratory distress.     Breath sounds: Normal breath sounds.  Abdominal:     Palpations: Abdomen is soft.     Tenderness: There is no abdominal tenderness.  Musculoskeletal:        General: No swelling.     Cervical back: Neck supple.     Right ankle: Normal.     Left ankle: Swelling present. No deformity, ecchymosis or lacerations. Tenderness present over the lateral malleolus. No medial malleolus, base of 5th metatarsal or proximal fibula tenderness. Normal pulse.     Left Achilles Tendon: Normal. No tenderness or defects. Thompson's test negative.  Skin:    General: Skin is warm and dry.     Capillary Refill: Capillary refill takes less than 2 seconds.  Neurological:     General: No focal deficit present.     Mental Status: She is alert and oriented to person, place, and time.  Psychiatric:        Mood and Affect: Mood normal.     ED Results / Procedures / Treatments   Labs (all labs ordered are listed, but only abnormal results are displayed) Labs Reviewed - No data to display  EKG None  Radiology DG Ankle Complete Left  Result Date: 06/06/2022 CLINICAL DATA:  Larey Seat with pain and swelling EXAM: LEFT ANKLE COMPLETE - 3+ VIEW COMPARISON:  None Available. FINDINGS: Nondisplaced avulsion fracture of the tip of the fibula. Marked lateral soft tissue swelling. No other regional fracture. IMPRESSION: Nondisplaced avulsion fracture of the tip of the fibula.  Electronically Signed   By: Paulina Fusi M.D.   On: 06/06/2022 13:32    Procedures Procedures    Medications Ordered in ED Medications  ibuprofen (ADVIL) tablet 600 mg (600 mg Oral Given 06/06/22 1411)  oxyCODONE (Oxy IR/ROXICODONE) immediate release tablet 5 mg (5 mg Oral Given 06/06/22 1427)    ED Course/ Medical Decision Making/ A&P                             Medical Decision Making This patient presents to the ED for concern of right ankle pain and swelling after a fall today, this involves an extensive number of treatment options, and is a complaint that carries with it a high risk of complications and morbidity.  The differential diagnosis includes fracture, subluxation, sprain, contusion, other   Additional history obtained:  Additional history obtained from EMR External records from outside source obtained and reviewed including prior ED visits, PDMP    Imaging Studies ordered:  I ordered imaging studies including left ankle xray  I independently visualized and interpreted imaging which showed distal fibula avulsion fracture I agree with the radiologist interpretation     Problem List / ED Course / Critical interventions / Medication management  Presents with left ankle pain after falling, she has marked swelling to the left lateral malleolus x-ray shows an avulsion fracture, no other fractures noted.  No proximal fibular tenderness, no knee or hip pain, no tenderness to the foot.  She has excellent pulses and good sensation in the foot.  Bear weight.  She was given a cam walker and crutches, advised on minimizing weightbearing, pain control and orthopedic follow-up.  No defect to the Achilles tendon noted on exam.  Initially given ibuprofen here in the ED, did not have significant pain relief with this, given 1 oxycodone here and given prescription for 5 for at home, discussed with her to use these only for more severe pain, should not be needed past the first couple of  days.  She states she cannot take Tylenol due to liver disease I ordered medication including ibuprofen and oxycodone for pain  Reevaluation of the patient after these medicines showed that the patient improved I have reviewed the patients home medicines and have made adjustments as needed      Amount and/or Complexity of Data Reviewed Radiology: ordered.  Risk Prescription drug management.           Final Clinical Impression(s) / ED Diagnoses Final diagnoses:  Avulsion fracture of ankle, left, closed, initial encounter  Rx / DC Orders ED Discharge Orders          Ordered    naproxen (NAPROSYN) 500 MG tablet  2 times daily        06/06/22 1433    oxyCODONE (ROXICODONE) 5 MG immediate release tablet  Every 4 hours PRN        06/06/22 1433              Josem Kaufmann 06/06/22 1536    Rondel Baton, MD 06/06/22 1701

## 2022-06-08 ENCOUNTER — Telehealth: Payer: Self-pay | Admitting: *Deleted

## 2022-06-08 NOTE — Transitions of Care (Post Inpatient/ED Visit) (Signed)
   06/08/2022  Name: [name] MRN: [id number] DOB: 04/28/1970  Today's TOC FU Call Status: Today's TOC FU Call Status:: Unsuccessul Call (1st Attempt) Unsuccessful Call (1st Attempt) Date: 06/08/22  Attempted to reach the patient regarding the most recent Inpatient/ED visit.  Follow Up Plan: Additional outreach attempts will be made to reach the patient to complete the Transitions of Care (Post Inpatient/ED visit) call.   Signature Majesta Leichter,RN

## 2022-06-21 NOTE — Transitions of Care (Post Inpatient/ED Visit) (Signed)
   06/21/2022  Name: [name] MRN: [id number] DOB: 09/02/1970  Today's TOC FU Call Status: Today's TOC FU Call Status:: Successful TOC FU Call Competed Unsuccessful Call (1st Attempt) Date: 06/08/22 [facility] FU Call Complete Date: 06/21/22  Transition Care Management Follow-up Telephone Call Date of Discharge: 06/06/22 Discharge Facility: [facility] Type of Discharge: Emergency Department Reason for ED Visit: Orthopedic Conditions Orthopedic/Injury Diagnosis: Sprain or Strain How have you been since you were released from the hospital?: Same Any questions or concerns?: Yes Patient Questions/Concerns:: States she's still in a lot of pain; stated she has some pain pills.  Items Reviewed: Did you receive and understand the discharge instructions provided?: Yes Medications obtained,verified, and reconciled?: Yes (Medications Reviewed) Any new allergies since your discharge?: No Dietary orders reviewed?: NA Do you have support at home?: Yes People in Home: parent(s) Name of Support/Comfort Primary Source: Stays with her mother.  Medications Reviewed Today: Medications Reviewed Today     Reviewed by Hendricks Limes, RN (Registered Nurse) on 06/06/22 at 1530  Med List Status: <None>   Medication Order Taking? Sig Documenting Provider Last Dose Status Informant  buPROPion (WELLBUTRIN SR) 150 MG 12 hr tablet [id number]  Take 1 tablet (150 mg total) by mouth 2 (two) times daily. Dolan Amen, MD  Expired 01/22/21 2359   buPROPion (WELLBUTRIN XL) 150 MG 24 hr tablet [id number]  Take 1 tablet (150 mg total) by mouth daily for 3 days. Dolan Amen, MD  Expired 10/29/20 2359   fluticasone (FLONASE) 50 MCG/ACT nasal spray [id number]  Place 1 spray into both nostrils daily. Caccavale, Sophia, PA-C  Active   hydrOXYzine (ATARAX/VISTARIL) 25 MG tablet [id number]  Take 1 tablet (25 mg total) by mouth every 8 (eight) hours as needed for itching. Gardenia Phlegm, MD  Active   predniSONE  (STERAPRED UNI-PAK 21 TAB) 10 MG (21) TBPK tablet [id number]  Day 1 take 6 tablets, Day 2 take 5 tablets, Day 3 take 4 tablets , Day 4 take 3 tablets, Day 5 take 2 tablets, Day 6 take 1 tablet Gardenia Phlegm, MD  Active             Home Care and Equipment/Supplies: Were Home Health Services Ordered?: No Any new equipment or medical supplies ordered?: Yes Name of Medical supply agency?: Boot and crutches. Were you able to get the equipment/medical supplies?: Yes Do you have any questions related to the use of the equipment/supplies?: No  Functional Questionnaire: Do you need assistance with bathing/showering or dressing?: No Do you need assistance with meal preparation?: No Do you need assistance with eating?: No Do you have difficulty maintaining continence: No Do you need assistance with getting out of bed/getting out of a chair/moving?: No Do you have difficulty managing or taking your medications?: No  Follow up appointments reviewed: PCP Follow-up appointment confirmed?: No Date of PCP follow-up appointment?: 06/25/22 (Pt wanted to schedule an appt.) Follow-up Provider: D [facility] Follow-up appointment confirmed?: Yes Follow-Up Specialty Provider:: Pt stated she has already seen the otho doctor. Do you need transportation to your follow-up appointment?: No Do you understand care options if your condition(s) worsen?: Yes-patient verbalized understanding    SIGNATURE Sharene Skeans

## 2022-06-25 ENCOUNTER — Encounter: Payer: Medicaid Other | Admitting: Student

## 2022-06-25 NOTE — Progress Notes (Deleted)
   CC:   HPI:  Ms.Allice Lang is a 52 y.o. female with a past medical history of anxiety and depression who presents for  Most recent office visit 05/11/2021 patient presented for routine screening for STI and skin lesions.  Patient also had follow-up given history of hepatitis C.  Patient did have bacteria by the last time, unclear if it was treated or not.  Most recent labs: 05/11/2021: Liver profile within normal limits 05/11/2021: Positive for bacterial vaginitis 10/26/2020: A1c 5.6  Plan: Plan to refer patient for mammogram, colonoscopy, refeill medicatiosn    Past Medical History:  Diagnosis Date   Anxiety and depression    History of hepatitis C      Current Outpatient Medications:    buPROPion (WELLBUTRIN SR) 150 MG 12 hr tablet, Take 1 tablet (150 mg total) by mouth 2 (two) times daily., Disp: 168 tablet, Rfl: 0   buPROPion (WELLBUTRIN XL) 150 MG 24 hr tablet, Take 1 tablet (150 mg total) by mouth daily for 3 days., Disp: 3 tablet, Rfl: 0   fluticasone (FLONASE) 50 MCG/ACT nasal spray, Place 1 spray into both nostrils daily., Disp: 16 g, Rfl: 0   hydrOXYzine (ATARAX/VISTARIL) 25 MG tablet, Take 1 tablet (25 mg total) by mouth every 8 (eight) hours as needed for itching., Disp: 20 tablet, Rfl: 0   naproxen (NAPROSYN) 500 MG tablet, Take 1 tablet (500 mg total) by mouth 2 (two) times daily., Disp: 10 tablet, Rfl: 0   oxyCODONE (ROXICODONE) 5 MG immediate release tablet, Take 1 tablet (5 mg total) by mouth every 4 (four) hours as needed for severe pain., Disp: 5 tablet, Rfl: 0   predniSONE (STERAPRED UNI-PAK 21 TAB) 10 MG (21) TBPK tablet, Day 1 take 6 tablets, Day 2 take 5 tablets, Day 3 take 4 tablets , Day 4 take 3 tablets, Day 5 take 2 tablets, Day 6 take 1 tablet, Disp: 1 each, Rfl: 0  Review of Systems:  ***  Constitutional: Eye: Respiratory: Cardiovascular: GI: MSK: GU: Skin: Neuro: Endocrine:   Physical Exam:  There were no vitals filed for this  visit. *** General: Patient is sitting comfortably in the room  Eyes: Pupils equal and reactive to light, EOM intact  Head: Normocephalic, atraumatic  Neck: Supple, nontender, full range of motion, No JVD Cardio: Regular rate and rhythm, no murmurs, rubs or gallops. 2+ pulses to bilateral upper and lower extremities  Chest: No chest tenderness Pulmonary: Clear to ausculation bilaterally with no rales, rhonchi, and crackles  Abdomen: Soft, nontender with normoactive bowel sounds with no rebound or guarding  Neuro: Alert and orientated x3. CN II-XII intact. Sensation intact to upper and lower extremities. 2+ patellar reflex.  Back: No midline tenderness, no step off or deformities noted. No paraspinal muscle tenderness.  Skin: No rashes noted  MSK: 5/5 strength to upper and lower extremities.    Assessment & Plan:   No problem-specific Assessment & Plan notes found for this encounter.    Patient {GC/GE:[id number]::"discussed with","seen with"} Dr. {NAMES:[id number]::"Guilloud","Hoffman","Mullen","Narendra","Williams","Vincent"}  Modena Slater, DO PGY-1 Internal Medicine Resident  Pager: [phone]

## 2022-07-17 ENCOUNTER — Encounter: Payer: Self-pay | Admitting: *Deleted

## 2022-07-23 ENCOUNTER — Encounter: Payer: Medicaid Other | Admitting: Student

## 2022-09-17 ENCOUNTER — Encounter: Payer: Self-pay | Admitting: Student

## 2022-09-17 ENCOUNTER — Other Ambulatory Visit (HOSPITAL_COMMUNITY)
Admission: RE | Admit: 2022-09-17 | Discharge: 2022-09-17 | Disposition: A | Discharge status: Home / Self Care | Payer: 59 | Source: Ambulatory Visit | Attending: Internal Medicine | Admitting: Internal Medicine

## 2022-09-17 ENCOUNTER — Ambulatory Visit (INDEPENDENT_AMBULATORY_CARE_PROVIDER_SITE_OTHER): Payer: 59 | Admitting: Student

## 2022-09-17 VITALS — BP 139/81 | HR 68 | Ht 62.0 in | Wt 137.3 lb

## 2022-09-17 DIAGNOSIS — H9201 Otalgia, right ear: Secondary | ICD-10-CM | POA: Insufficient documentation

## 2022-09-17 DIAGNOSIS — F17210 Nicotine dependence, cigarettes, uncomplicated: Secondary | ICD-10-CM | POA: Diagnosis not present

## 2022-09-17 DIAGNOSIS — G43809 Other migraine, not intractable, without status migrainosus: Secondary | ICD-10-CM

## 2022-09-17 DIAGNOSIS — Z8742 Personal history of other diseases of the female genital tract: Secondary | ICD-10-CM | POA: Insufficient documentation

## 2022-09-17 DIAGNOSIS — L989 Disorder of the skin and subcutaneous tissue, unspecified: Secondary | ICD-10-CM

## 2022-09-17 DIAGNOSIS — Z0000 Encounter for general adult medical examination without abnormal findings: Secondary | ICD-10-CM

## 2022-09-17 DIAGNOSIS — N644 Mastodynia: Secondary | ICD-10-CM

## 2022-09-17 DIAGNOSIS — M25572 Pain in left ankle and joints of left foot: Secondary | ICD-10-CM | POA: Diagnosis not present

## 2022-09-17 DIAGNOSIS — G8929 Other chronic pain: Secondary | ICD-10-CM

## 2022-09-17 DIAGNOSIS — Z720 Tobacco use: Secondary | ICD-10-CM

## 2022-09-17 DIAGNOSIS — G43909 Migraine, unspecified, not intractable, without status migrainosus: Secondary | ICD-10-CM | POA: Insufficient documentation

## 2022-09-17 MED ORDER — NAPROXEN 500 MG PO TABS: 500.0000 mg | ORAL_TABLET | Freq: Two times a day (BID) | ORAL | 0 refills | Status: DC

## 2022-09-17 MED ORDER — TRIAMCINOLONE ACETONIDE 0.025 % EX OINT: 1.0000 | TOPICAL_OINTMENT | Freq: Two times a day (BID) | CUTANEOUS | 0 refills | Status: DC

## 2022-09-17 MED ORDER — FLUTICASONE PROPIONATE 50 MCG/ACT NA SUSP: 2.0000 | Freq: Every day | NASAL | 0 refills | Status: DC

## 2022-09-17 NOTE — Patient Instructions (Signed)
Thank you, [name] for allowing [place] to provide your care today. Today we discussed your right ear pain, left ankle pain, generalized itching, left breast pain, and pap smear.   I have ordered the following labs for you:  Pap smear/cytology  Tests ordered today:  None  Referrals ordered today:   Referral Orders         Ambulatory referral to Physical Therapy       I have ordered the following medication/changed the following medications:   Stop the following medications: Medications Discontinued During This Encounter  Medication Reason   predniSONE (STERAPRED UNI-PAK 21 TAB) 10 MG (21) TBPK tablet    oxyCODONE (ROXICODONE) 5 MG immediate release tablet      Start the following medications: Meds ordered this encounter  Medications   triamcinolone (KENALOG) 0.025 % ointment    Sig: Apply 1 Application topically 2 (two) times daily. Apply to affected areas up to two times daily, can be used daily two weeks at a time. After 2 weeks, take a break. Can also use as spot treatment as needed. Please avoid using on face, can use on scalp, and on arms, legs.    Dispense:  80 g    Refill:  0     Follow up:   4 weeks (1 month)   Remember:   - Right ear pain: Please use a neti pot to clear out your sinuses before you use your flonase (2 sprays in each nostril). We also want you to get Allegra (over the counter) and take it as directed on the instructions. We will follow up with you in 1 month to determine progress. If your ear pain/inflammation remains the same we can consider a referral to a specialist.   - Left ankle pain: You were prescribed Naproxen 500 mg twice a day for pain as needed. If you need any refills please let us know. You were referred to physical therapy today. They will call you to help set up your first appointment.   - Left breast pain: You have been referred for a diagnostic mammogram for left breast pain. They will call to help you coordinate that exam.   -  Pap smear: You received a pap smear today due to a history of abnormal pap smears. The one last year was normal. I will call you with the results of this pap smear when they are back.   - Generalized itching: I sent in a prescription for triamcinolone cream and some instructions on how to use it. The Allegra will also help as it is an antihistamine and it helps with itch. We will see you back in a month and if the itching has not improved we can discuss a dermatology referral for potential biopsy to help determine if you have prurigo nodularis.   - Smoking cessation: We can discuss your smoking cessation at your follow up visit in a month.  Should you have any questions or concerns please call the internal medicine clinic at [phone].     Fawne Hughley Colbert Coyer, MD PGY-1 Internal Medicine Teaching [facility]

## 2022-09-17 NOTE — Assessment & Plan Note (Addendum)
History of eustachian tube dysfunction and tubes placed in ears as a child. Per patient, she has a history of recurrent sinus infections and uses flonase, one spray in each nose, as needed. Reports she feels like her right ear is clogged and her hearing changes/is affected when that happens. Right ear canal erythematous on exam today. Tympanic membrane with scarring and possible fluid/discharge in middle ear. Left ear with scarring but no inflammation compared to the right. Nares moist, throat normal (no erythema/edema). Patient with upper airway congestion but denies any recent illness, fever, chills, N/V, or coughing. Does have occasional sneezing. Will follow-up in 1 month to determine if symptoms have improved. Plan to manage conservatively, can consider referral if conservative approach is ineffective and symptoms remain or worsen.  Plan:  - Patient received instructions on use of neti pot to flush out sinuses before using flonase - Flonase 2 sprays in each nostril daily as needed (increased from one spray in each nostril) - OTC Allegra as directed

## 2022-09-17 NOTE — Assessment & Plan Note (Signed)
Patient reports left breast pain present for about a year. Denies any trauma to the area. Believes pain improves with change in position. Difficult for her to perform a self examination as she has bilateral breast implants in place (saline, placed 23 years ago). Last mammogram 2018. No family history of breast cancer but patient reports family history of other cancers.  Plan: - Ordered a diagnostic mammogram today, will follow-up with patient at next visit

## 2022-09-17 NOTE — Assessment & Plan Note (Signed)
Per patient, she has a history of abnormal pap smears. Last pap smear performed 05/12/22 with HPV co-testing, normal results. Patient has not been sexually active in the last few months and declined STI screening. Will review abnormal pap smear history to determine proper screening schedule with patient.  Plan:  - Repeat pap smear, follow-up with results as needed

## 2022-09-17 NOTE — Assessment & Plan Note (Addendum)
Patient presented to ED on 06/06/22 for closed fracture of the left ankle. Told it could be managed non-operatively. Given a boot to be worn as needed. Patient given short course of oxy 5 and Naproxen 500 BID for pain management. Has not been to physical therapy. Patient comes today with continued discomfort with walking of the left ankle. Pointed to heel as place of greatest pain with walking. Has compensatory gait with boot in place. Physical exam of left ankle with normal strength, sensation, and intact pulses. ROM limited by tenderness/pain. Reports pain negatively impacts her ability to participate in daily activities.  Plan:  - Refill Naproxen 500 mg BID  - Refer to physical therapy since function is impacted

## 2022-09-17 NOTE — Assessment & Plan Note (Addendum)
Patient with history of migraines. Wants to discuss management and changes to frequency at next visit. Patient expressed concern over recent passing of sister in law who was diagnosed with a brain tumor. Would like reassurance that her migraines are 'normal' for her. Denied vision changes or weight changes, but did endorse increased amounts of stress (family) in her life lately.  Plan:  - Discuss migraine management at next visit

## 2022-09-17 NOTE — Assessment & Plan Note (Signed)
History of generalized itch. Today patient complains of itching on scalp, suspects either lice or mites as she has been working with chickens. Patient was seen by FNP on 04/04/22 at [facility] who prescribed atarax for itch and triamcinolone injection and cream. Patient reports improved symptoms with those medications but has not been using them recently. Physical exam negative for lice, mites, or seborrheic dermatitis. Observed 2 nodules with overlying excoriations similar to the ones on patient's arms and back. 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 Will try to manage pruritus with antihistamines (Allegra) and topical triamcinolone. Will follow up in a month and can consider dermatology referral for biopsy.  Plan:  - OTC Allegra as directed - Triamcinolone 0.025% ointment (can be used on scalp, arms, back, legs, BID up to 2 weeks at a time, then patient instructed to take a break) - Follow-up in 1 month

## 2022-09-17 NOTE — Assessment & Plan Note (Signed)
Patient mentioned desire to discuss smoking cessation towards end of visit. Can address at 1 month follow-up visit. Of note, patient has taken Chantix in the past.

## 2022-09-17 NOTE — Progress Notes (Signed)
New Patient Office Visit  Subjective    Patient ID: [name], female    DOB: [birth year]  Age: 52 y.o. MRN: [id number]  CC:  Chief Complaint  Patient presents with   Medication Refill   Gynecologic Exam   Annual Exam   Ankle Injury    Broke left ankle about 3 months ago, still having issues   Referral    Mammogram left breast pain when laying on her sides  eye doctor    HPI [name] presents to follow up on health maintenance exams (pap smear), right ear pain, left ankle pain, and generalized itch. She was last seen on 05/11/2021 with Dr. Marijo Conception for rash, STI screening, and pap smear. Mentioned wanting to discuss smoking cessation and migraines, will discuss these with her at next visit. Patient accompanied by granddaughter [name] today.   Outpatient Encounter Medications as of 09/17/2022  Medication Sig   triamcinolone (KENALOG) 0.025 % ointment Apply 1 Application topically 2 (two) times daily. Apply to affected areas up to two times daily, can be used daily two weeks at a time. After 2 weeks, take a break. Can also use as spot treatment as needed. Please avoid using on face, can use on scalp, and on arms, legs.   buPROPion (WELLBUTRIN SR) 150 MG 12 hr tablet Take 1 tablet (150 mg total) by mouth 2 (two) times daily.   buPROPion (WELLBUTRIN XL) 150 MG 24 hr tablet Take 1 tablet (150 mg total) by mouth daily for 3 days.   fluticasone (FLONASE) 50 MCG/ACT nasal spray Place 2 sprays into both nostrils daily.   naproxen (NAPROSYN) 500 MG tablet Take 1 tablet (500 mg total) by mouth 2 (two) times daily.   [DISCONTINUED] fluticasone (FLONASE) 50 MCG/ACT nasal spray Place 1 spray into both nostrils daily.   [DISCONTINUED] hydrOXYzine (ATARAX/VISTARIL) 25 MG tablet Take 1 tablet (25 mg total) by mouth every 8 (eight) hours as needed for itching.   [DISCONTINUED] naproxen (NAPROSYN) 500 MG tablet Take 1 tablet (500 mg total) by mouth 2 (two) times daily.   [DISCONTINUED] oxyCODONE  (ROXICODONE) 5 MG immediate release tablet Take 1 tablet (5 mg total) by mouth every 4 (four) hours as needed for severe pain.   [DISCONTINUED] predniSONE (STERAPRED UNI-PAK 21 TAB) 10 MG (21) TBPK tablet Day 1 take 6 tablets, Day 2 take 5 tablets, Day 3 take 4 tablets , Day 4 take 3 tablets, Day 5 take 2 tablets, Day 6 take 1 tablet   No facility-administered encounter medications on file as of 09/17/2022.    Past Medical History:  Diagnosis Date   Anxiety and depression    History of hepatitis C     Past Surgical History:  Procedure Laterality Date   AUGMENTATION MAMMAPLASTY Bilateral    BREAST ENHANCEMENT SURGERY     CESAREAN SECTION     MYRINGOTOMY      Family History  Problem Relation Age of Onset   Cancer Other    Ovarian cancer Maternal Aunt        deceased    Social History   Socioeconomic History   Marital status: Single    Spouse name: Not on file   Number of children: Not on file   Years of education: Not on file   Highest education level: Not on file  Occupational History   Not on file  Tobacco Use   Smoking status: Every Day    Current packs/day: 0.50    Average packs/day: 0.5 packs/day for  30.0 years (15.0 ttl pk-yrs)    Types: Cigarettes   Smokeless tobacco: Never   Tobacco comments:    0.5 PPD  Vaping Use   Vaping status: Some Days  Substance and Sexual Activity   Alcohol use: No   Drug use: Not Currently    Types: Marijuana    Comment: denies   Sexual activity: Not on file  Other Topics Concern   Not on file  Social History Narrative   Not on file   Social Determinants of Health   Financial Resource Strain: Not on file  Food Insecurity: Not on file  Transportation Needs: Not on file  Physical Activity: Not on file  Stress: No Stress Concern Present (10/10/2021)   [facility]14[facility] visits prior to 04/14/2022.   [employer] - Occupational Stress Questionnaire    Feeling of Stress : Not at all  Social Connections: Unknown (06/24/2021)   Received from [facility], [facility]   Social Network    Social Network: Not on file  Intimate Partner Violence: Not At Risk (10/10/2021)   [facility] visits prior to 04/14/2022., [facility] visits prior to 04/14/2022.   Humiliation, Afraid, Rape, and Kick questionnaire    Fear of Current or Ex-Partner: No    Emotionally Abused: No    Physically Abused: No    Sexually Abused: No    Review of Systems  Constitutional:  Negative for chills and fever.  HENT:  Positive for ear discharge, ear pain and hearing loss.        R ear affected  Eyes:  Negative for blurred vision and double vision.  Respiratory:         Upper airway congestion  Cardiovascular:  Negative for chest pain and palpitations.  Gastrointestinal:  Negative for heartburn, nausea and vomiting.  Genitourinary:  Negative for dysuria.  Skin:  Positive for itching.       Generalized pruritus, more so on the scalp lately.   Neurological:  Positive for headaches.        Objective    BP 139/81 (BP Location: Left Arm, Patient Position: Sitting, Cuff Size: Normal)   Pulse 68   Ht 5\' 2"  (1.575 m)   Wt 137 lb 4.8 oz (62.3 kg)   LMP  (LMP Unknown)   SpO2 98%   BMI 25.11 kg/m   Physical Exam Constitutional:      General: She is not in acute distress. HENT:     Head: Normocephalic and atraumatic.     Left Ear: Tympanic membrane, ear canal and external ear normal.     Ears:     Comments: Right tympanic membrane with scarring, possible fluid in middle ear, ear canal erythematous and irritated on exam    Mouth/Throat:     Mouth: Mucous membranes are moist.  Eyes:     Extraocular Movements: Extraocular movements intact.     Pupils: Pupils are equal, round, and reactive to light.  Cardiovascular:     Rate and  Rhythm: Normal rate and regular rhythm.  Pulmonary:     Effort: Pulmonary effort is normal.     Breath sounds: Normal breath sounds.  Abdominal:     General: Bowel sounds are normal. There is no distension.     Palpations: Abdomen is soft.     Tenderness: There is no abdominal  tenderness.  Genitourinary:    General: Normal vulva.  Musculoskeletal:        General: Normal range of motion.     Comments: No swelling, sensation, or strength changes in left lower extremity. Pulses intact.   Skin:    General: Skin is warm and dry.  Neurological:     Mental Status: She is alert.  Psychiatric:        Mood and Affect: Mood normal.        Behavior: Behavior normal.         Assessment & Plan:   Problem List Items Addressed This Visit       Cardiovascular and Mediastinum   Migraine    Patient with history of migraines. Wants to discuss management and changes to frequency at next visit. Patient expressed concern over recent passing of sister in law who was diagnosed with a brain tumor. Would like reassurance that her migraines are 'normal' for her. Denied vision changes or weight changes, but did endorse increased amounts of stress (family) in her life lately.  Plan:  - Discuss migraine management at next visit      Relevant Medications   naproxen (NAPROSYN) 500 MG tablet     Musculoskeletal and Integument   Skin lesions, generalized    History of generalized itch. Today patient complains of itching on scalp, suspects either lice or mites as she has been working with chickens. Patient was seen by FNP on 04/04/22 at [facility] who prescribed atarax for itch and triamcinolone injection and cream. Patient reports improved symptoms with those medications but has not been using them recently. Physical exam negative for lice, mites, or seborrheic dermatitis. Observed 2 nodules with overlying excoriations similar to the ones on patient's arms and back. 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 Will try to manage pruritus with antihistamines (Allegra) and topical triamcinolone. Will follow up in a month and can consider dermatology referral for biopsy.  Plan:  - OTC Allegra as directed - Triamcinolone 0.025% ointment (can be used on scalp, arms, back, legs, BID up to 2 weeks at a time, then patient instructed to take a break) - Follow-up in 1 month        Other   Tobacco abuse (Chronic)    Patient mentioned desire to discuss smoking cessation towards end of visit. Can address at 1 month follow-up visit. Of note, patient has taken Chantix in the past.       Healthcare maintenance   Left ankle pain    Patient presented to ED on 06/06/22 for closed fracture of the left ankle. Told it could be managed non-operatively. Given a boot to be worn as needed. Patient given short course of oxy 5 and Naproxen 500 BID for pain management. Has not been to physical therapy. Patient comes today with continued discomfort with walking of the left ankle. Pointed to heel as place of greatest pain with walking. Has compensatory gait with boot in place. Physical exam of left ankle with normal strength, sensation, and intact pulses. ROM limited by tenderness/pain. Reports pain negatively impacts her ability to participate in daily activities.  Plan:  - Refill Naproxen 500 mg BID  - Refer to physical therapy since function is impacted      Relevant Orders   Ambulatory referral to Physical Therapy   Right ear pain    History of eustachian tube dysfunction and tubes placed in  ears as a child. Per patient, she has a history of recurrent sinus infections and uses flonase, one spray in each nose, as needed. Reports she feels like her right ear is clogged and her hearing changes/is affected when that happens. Right ear canal erythematous on exam today. Tympanic membrane with scarring and possible fluid/discharge in middle ear. Left  ear with scarring but no inflammation compared to the right. Nares moist, throat normal (no erythema/edema). Patient with upper airway congestion but denies any recent illness, fever, chills, N/V, or coughing. Does have occasional sneezing. Will follow-up in 1 month to determine if symptoms have improved. Plan to manage conservatively, can consider referral if conservative approach is ineffective and symptoms remain or worsen.  Plan:  - Patient received instructions on use of neti pot to flush out sinuses before using flonase - Flonase 2 sprays in each nostril daily as needed (increased from one spray in each nostril) - OTC Allegra as directed      Breast pain, left - Primary    Patient reports left breast pain present for about a year. Denies any trauma to the area. Believes pain improves with change in position. Difficult for her to perform a self examination as she has bilateral breast implants in place (saline, placed 23 years ago). Last mammogram 2018. No family history of breast cancer but patient reports family history of other cancers.  Plan: - Ordered a diagnostic mammogram today, will follow-up with patient at next visit       Relevant Orders   MM 3D DIAGNOSTIC MAMMOGRAM BILATERAL BREAST W/IMPLANT   History of abnormal cervical Pap smear    Per patient, she has a history of abnormal pap smears. Last pap smear performed 05/12/22 with HPV co-testing, normal results. Patient has not been sexually active in the last few months and declined STI screening. Will review abnormal pap smear history to determine proper screening schedule with patient.  Plan:  - Repeat pap smear, follow-up with results as needed      Relevant Orders   Cytology -Pap Smear    Return in about 4 weeks (around 10/15/2022) for right ear pain, generalized itch, migraine.   Patient seen with Dr. Antony Contras.  Susan Sorrels Colbert Coyer, MD

## 2022-09-18 NOTE — Progress Notes (Signed)
Internal Medicine Clinic Attending  I was physically present during the key portions of the resident provided service and participated in the medical decision making of patient's management care. I reviewed pertinent patient test results.  The assessment, diagnosis, and plan were formulated together and I agree with the documentation in the resident's note.  I personally performed the pelvic exam which showed normal external genitalia, no significant vaginal discharge. Cervical os was visualized without gross abnormality on speculum exam. F/u PAP smear.   Reymundo Poll, MD

## 2022-09-18 NOTE — Addendum Note (Signed)
Addended by: Burnell Blanks on: 09/18/2022 03:55 PM   Modules accepted: Level of Service

## 2022-09-24 ENCOUNTER — Other Ambulatory Visit: Payer: Self-pay | Admitting: Student

## 2022-09-24 ENCOUNTER — Telehealth: Payer: Self-pay | Admitting: Student

## 2022-09-24 DIAGNOSIS — Z0000 Encounter for general adult medical examination without abnormal findings: Secondary | ICD-10-CM

## 2022-09-24 NOTE — Telephone Encounter (Signed)
Pt rtn call about her Pap Smear Results

## 2022-09-27 ENCOUNTER — Other Ambulatory Visit: Payer: Self-pay | Admitting: Student

## 2022-09-27 DIAGNOSIS — N644 Mastodynia: Secondary | ICD-10-CM

## 2022-10-08 NOTE — Therapy (Deleted)
OUTPATIENT PHYSICAL THERAPY LOWER EXTREMITY EVALUATION   Patient Name: [name] MRN: [id number] DOB:Jul 24, 1970, 52 y.o., female Today's Date: 10/08/2022  END OF SESSION:   Past Medical History:  Diagnosis Date   Anxiety and depression    History of hepatitis C    Past Surgical History:  Procedure Laterality Date   AUGMENTATION MAMMAPLASTY Bilateral    BREAST ENHANCEMENT SURGERY     CESAREAN SECTION     MYRINGOTOMY     Patient Active Problem List   Diagnosis Date Noted   Left ankle pain 09/17/2022   Right ear pain 09/17/2022   Breast pain, left 09/17/2022   Migraine 09/17/2022   History of abnormal cervical Pap smear 09/17/2022   Routine screening for STI (sexually transmitted infection) 05/12/2021   Healthcare maintenance 05/12/2021   Fatigue 10/26/2020   Skin lesions, generalized 08/21/2019   Tobacco abuse 03/27/2016   Anxiety and depression 03/18/2016   History of hepatitis C     PCP: ***  REFERRING PROVIDER: ***  REFERRING DIAG: ***  THERAPY DIAG:  No diagnosis found.  Rationale for Evaluation and Treatment: {HABREHAB:27488}  ONSET DATE: ***  SUBJECTIVE:   SUBJECTIVE STATEMENT: ***  PERTINENT HISTORY: *** PAIN:  Are you having pain? {OPRCPAIN:27236}  PRECAUTIONS: {Therapy precautions:24002}  RED FLAGS: {PT Red Flags:29287}   WEIGHT BEARING RESTRICTIONS: {Yes ***/No:24003}  FALLS:  Has patient fallen in last 6 months? {fallsyesno:27318}  LIVING ENVIRONMENT: Lives with: {OPRC lives with:25569::"lives with their family"} Lives in: {Lives in:25570} Stairs: {opstairs:27293} Has following equipment at home: {Assistive devices:23999}  OCCUPATION: ***  PLOF: {PLOF:24004}  PATIENT GOALS: ***  NEXT MD VISIT: ***  OBJECTIVE:   DIAGNOSTIC FINDINGS: ***  PATIENT SURVEYS:  {rehab surveys:24030}  COGNITION: Overall cognitive status: {cognition:24006}     SENSATION: {sensation:27233}  EDEMA:  {edema:24020}  MUSCLE  LENGTH: Hamstrings: Right *** deg; Left *** deg Thomas test: Right *** deg; Left *** deg  POSTURE: {posture:25561}  PALPATION: ***  LOWER EXTREMITY ROM:  {AROM/PROM:27142} ROM Right eval Left eval  Hip flexion    Hip extension    Hip abduction    Hip adduction    Hip internal rotation    Hip external rotation    Knee flexion    Knee extension    Ankle dorsiflexion    Ankle plantarflexion    Ankle inversion    Ankle eversion     (Blank rows = not tested)  LOWER EXTREMITY MMT:  MMT Right eval Left eval  Hip flexion    Hip extension    Hip abduction    Hip adduction    Hip internal rotation    Hip external rotation    Knee flexion    Knee extension    Ankle dorsiflexion    Ankle plantarflexion    Ankle inversion    Ankle eversion     (Blank rows = not tested)  LOWER EXTREMITY SPECIAL TESTS:  {LEspecialtests:26242}  FUNCTIONAL TESTS:  {Functional tests:24029}  GAIT: Distance walked: *** Assistive device utilized: {Assistive devices:23999} Level of assistance: {Levels of assistance:24026} Comments: ***   TODAY'S TREATMENT:  DATE: ***    PATIENT EDUCATION:  Education details: *** Person educated: {Person educated:25204} Education method: {Education Method:25205} Education comprehension: {Education Comprehension:25206}  HOME EXERCISE PROGRAM: ***  ASSESSMENT:  CLINICAL IMPRESSION: Patient is a *** y.o. *** who was seen today for physical therapy evaluation and treatment for ***.   OBJECTIVE IMPAIRMENTS: {opptimpairments:25111}.   ACTIVITY LIMITATIONS: {activitylimitations:27494}  PARTICIPATION LIMITATIONS: {participationrestrictions:25113}  PERSONAL FACTORS: {Personal factors:25162} are also affecting patient's functional outcome.   REHAB POTENTIAL: {rehabpotential:25112}  CLINICAL DECISION MAKING: {clinical  decision making:25114}  EVALUATION COMPLEXITY: {Evaluation complexity:25115}   GOALS: Goals reviewed with patient? {yes/no:20286}  SHORT TERM GOALS: Target date: *** *** Baseline: Goal status: INITIAL  2.  *** Baseline:  Goal status: INITIAL  3.  *** Baseline:  Goal status: INITIAL  4.  *** Baseline:  Goal status: INITIAL  5.  *** Baseline:  Goal status: INITIAL  6.  *** Baseline:  Goal status: INITIAL  LONG TERM GOALS: Target date: ***  *** Baseline:  Goal status: INITIAL  2.  *** Baseline:  Goal status: INITIAL  3.  *** Baseline:  Goal status: INITIAL  4.  *** Baseline:  Goal status: INITIAL  5.  *** Baseline:  Goal status: INITIAL  6.  *** Baseline:  Goal status: INITIAL   PLAN:  PT FREQUENCY: {rehab frequency:25116}  PT DURATION: {rehab duration:25117}  PLANNED INTERVENTIONS: {rehab planned interventions:25118::"Therapeutic exercises","Therapeutic activity","Neuromuscular re-education","Balance training","Gait training","Patient/Family education","Self Care","Joint mobilization"}  PLAN FOR NEXT SESSION: ***   Lajoyce Tamura, PT 10/08/2022, 4:47 PM

## 2022-10-09 ENCOUNTER — Ambulatory Visit: Payer: 59 | Attending: Internal Medicine | Admitting: Physical Therapy

## 2022-10-17 ENCOUNTER — Other Ambulatory Visit: Payer: 59

## 2022-10-23 NOTE — Therapy (Deleted)
OUTPATIENT PHYSICAL THERAPY LOWER EXTREMITY EVALUATION   Patient Name: [name] MRN: [id number] DOB:1971-01-25, 52 y.o., female Today's Date: 10/23/2022  END OF SESSION:   Past Medical History:  Diagnosis Date   Anxiety and depression    History of hepatitis C    Past Surgical History:  Procedure Laterality Date   AUGMENTATION MAMMAPLASTY Bilateral    BREAST ENHANCEMENT SURGERY     CESAREAN SECTION     MYRINGOTOMY     Patient Active Problem List   Diagnosis Date Noted   Left ankle pain 09/17/2022   Right ear pain 09/17/2022   Breast pain, left 09/17/2022   Migraine 09/17/2022   History of abnormal cervical Pap smear 09/17/2022   Routine screening for STI (sexually transmitted infection) 05/12/2021   Healthcare maintenance 05/12/2021   Fatigue 10/26/2020   Skin lesions, generalized 08/21/2019   Tobacco abuse 03/27/2016   Anxiety and depression 03/18/2016   History of hepatitis C     PCP: Colbert Coyer, Priscila MD   REFERRING PROVIDER: ***  REFERRING DIAG:  Diagnosis  M25.572,G89.29 (ICD-10-CM) - Chronic pain of left ankle    THERAPY DIAG:  No diagnosis found.  Rationale for Evaluation and Treatment: {HABREHAB:27488}  ONSET DATE: ***  SUBJECTIVE:   SUBJECTIVE STATEMENT: ***  PERTINENT HISTORY: Pt presents with ankle pain. She states she was seen here last week for same and diagnosed with an ankle fracture. Pt states she was seen by Ortho 3-4 days ago. She reports sharp pains that shoot up her leg. She states she needs something for the pain.  [name] is a pleasant 52 y.o. who presents for ongoing follow-up evaluation of her left ankle distal fibula fracture. States that overall she is doing better. She states that she has been wearing the boot but admits that she has been doing sometimes without using it at home as well as when she is in the bed. She states that there is a little bit of swelling but does not attribute that she hit the outside of  her ankle last night has not some swelling since then. She rates her pain as a 0 out of 10. She has no other concerns. She wished wants to make sure that the ankle is doing well so she can keep up with her young kids.    PAIN:  Are you having pain? {OPRCPAIN:27236}  PRECAUTIONS: {Therapy precautions:24002}  RED FLAGS: {PT Red Flags:29287}   WEIGHT BEARING RESTRICTIONS: {Yes ***/No:24003}  FALLS:  Has patient fallen in last 6 months? {fallsyesno:27318}  LIVING ENVIRONMENT: Lives with: {OPRC lives with:25569::"lives with their family"} Lives in: {Lives in:25570} Stairs: {opstairs:27293} Has following equipment at home: {Assistive devices:23999}  OCCUPATION: ***  PLOF: {PLOF:24004}  PATIENT GOALS: ***  NEXT MD VISIT: ***  OBJECTIVE:   DIAGNOSTIC FINDINGS: ***  PATIENT SURVEYS:  {rehab surveys:24030}  COGNITION: Overall cognitive status: {cognition:24006}     SENSATION: {sensation:27233}  EDEMA:  {edema:24020}  MUSCLE LENGTH: Hamstrings: Right *** deg; Left *** deg Thomas test: Right *** deg; Left *** deg  POSTURE: {posture:25561}  PALPATION: ***  LOWER EXTREMITY ROM:  {AROM/PROM:27142} ROM Right eval Left eval  Hip flexion    Hip extension    Hip abduction    Hip adduction    Hip internal rotation    Hip external rotation    Knee flexion    Knee extension    Ankle dorsiflexion    Ankle plantarflexion    Ankle inversion    Ankle eversion     (  Blank rows = not tested)  LOWER EXTREMITY MMT:  MMT Right eval Left eval  Hip flexion    Hip extension    Hip abduction    Hip adduction    Hip internal rotation    Hip external rotation    Knee flexion    Knee extension    Ankle dorsiflexion    Ankle plantarflexion    Ankle inversion    Ankle eversion     (Blank rows = not tested)  LOWER EXTREMITY SPECIAL TESTS:  {LEspecialtests:26242}  FUNCTIONAL TESTS:  {Functional tests:24029}  GAIT: Distance walked: *** Assistive device  utilized: {Assistive devices:23999} Level of assistance: {Levels of assistance:24026} Comments: ***   TODAY'S TREATMENT:                                                                                                                              DATE: ***    PATIENT EDUCATION:  Education details: *** Person educated: {Person educated:25204} Education method: {Education Method:25205} Education comprehension: {Education Comprehension:25206}  HOME EXERCISE PROGRAM: ***  ASSESSMENT:  CLINICAL IMPRESSION: Patient is a *** y.o. *** who was seen today for physical therapy evaluation and treatment for ***.   OBJECTIVE IMPAIRMENTS: {opptimpairments:25111}.   ACTIVITY LIMITATIONS: {activitylimitations:27494}  PARTICIPATION LIMITATIONS: {participationrestrictions:25113}  PERSONAL FACTORS: {Personal factors:25162} are also affecting patient's functional outcome.   REHAB POTENTIAL: {rehabpotential:25112}  CLINICAL DECISION MAKING: {clinical decision making:25114}  EVALUATION COMPLEXITY: {Evaluation complexity:25115}   GOALS: Goals reviewed with patient? {yes/no:20286}  SHORT TERM GOALS: Target date: *** *** Baseline: Goal status: INITIAL  2.  *** Baseline:  Goal status: INITIAL  3.  *** Baseline:  Goal status: INITIAL  4.  *** Baseline:  Goal status: INITIAL  5.  *** Baseline:  Goal status: INITIAL  6.  *** Baseline:  Goal status: INITIAL  LONG TERM GOALS: Target date: ***  *** Baseline:  Goal status: INITIAL  2.  *** Baseline:  Goal status: INITIAL  3.  *** Baseline:  Goal status: INITIAL  4.  *** Baseline:  Goal status: INITIAL  5.  *** Baseline:  Goal status: INITIAL  6.  *** Baseline:  Goal status: INITIAL   PLAN:  PT FREQUENCY: {rehab frequency:25116}  PT DURATION: {rehab duration:25117}  PLANNED INTERVENTIONS: {rehab planned interventions:25118::"Therapeutic exercises","Therapeutic activity","Neuromuscular  re-education","Balance training","Gait training","Patient/Family education","Self Care","Joint mobilization"}  PLAN FOR NEXT SESSION: ***   Hanish Laraia, PT 10/23/2022, 1:37 PM

## 2022-10-24 ENCOUNTER — Ambulatory Visit: Payer: 59 | Admitting: Physical Therapy

## 2022-11-14 ENCOUNTER — Ambulatory Visit: Payer: 59 | Attending: Internal Medicine

## 2023-01-09 ENCOUNTER — Inpatient Hospital Stay: Admission: RE | Admit: 2023-01-09 | Payer: 59 | Source: Ambulatory Visit

## 2023-02-14 ENCOUNTER — Telehealth: Payer: Self-pay | Admitting: Internal Medicine

## 2023-02-14 MED ORDER — PANTOPRAZOLE SODIUM 40 MG PO TBEC: 40.0000 mg | DELAYED_RELEASE_TABLET | Freq: Every day | ORAL | 0 refills | Status: DC

## 2023-02-14 NOTE — Telephone Encounter (Signed)
  Patient called after hours pager. She states that for the last week or so she has been experiencing a lot of gas and has been burping more frequently than usual. Her burps have also smelled like eggs, which has been usual for her.  This has been worsened after eating, and she notes in particular, mountain dew made it worse. She also describes having heart burn, but denies any chest pain or weight loss. She also denies any nausea or vomiting. Her appetite has been reduced because of this, but she has been able to keep food down when she does eat.   In addition to her heart burn, she has been having about 4 episodes of diarrhea per day. She denies melena or hematochezia, but states that this started around the same time as her gas/heart burn. She does have some cramping with her bowel movements and has been fatigued because of this. She denies any new food exposures/food poisoning. No one at home has similar symptoms. I advised her to take OTC Imodium, but if her diarrhea gets worse, or if she has fevers, to go to the ER. I also sent in protonix  40 mg daily x 30 days for her new reflux. She will need to be evaluated in the [facility] for this and will call tomorrow to make an appt.

## 2023-03-13 ENCOUNTER — Other Ambulatory Visit (HOSPITAL_COMMUNITY): Payer: Self-pay

## 2023-03-13 ENCOUNTER — Encounter: Payer: Self-pay | Admitting: Student

## 2023-03-13 ENCOUNTER — Ambulatory Visit (INDEPENDENT_AMBULATORY_CARE_PROVIDER_SITE_OTHER): Payer: 59 | Admitting: Student

## 2023-03-13 VITALS — BP 154/98 | HR 61 | Temp 98.1°F | Ht 62.0 in | Wt 138.8 lb

## 2023-03-13 DIAGNOSIS — R1013 Epigastric pain: Secondary | ICD-10-CM

## 2023-03-13 DIAGNOSIS — R030 Elevated blood-pressure reading, without diagnosis of hypertension: Secondary | ICD-10-CM | POA: Diagnosis not present

## 2023-03-13 MED ORDER — AMLODIPINE BESYLATE 5 MG PO TABS
5.0000 mg | ORAL_TABLET | Freq: Every day | ORAL | 2 refills | Status: DC
  Filled 2023-03-13: qty 30, 30d supply, fill #0

## 2023-03-13 MED ORDER — FLUTICASONE PROPIONATE 50 MCG/ACT NA SUSP: 2.0000 | Freq: Every day | NASAL | 0 refills | Status: DC

## 2023-03-13 NOTE — Progress Notes (Signed)
Established Patient Office Visit  Subjective   Patient ID: [name], female    DOB: 10/31/1970  Age: 53 y.o. MRN: [id number]  Chief Complaint  Patient presents with   GERD     Pt states she is burping the smells of eggs, and feels burning sensation on her upper chest.    Emesis    Threw up in the morning, sometimes it would be light green. Also having left side pain.    Nausea    Patient is a 53 y.o. with a past medical history stated below who presents today for follow-up for dyspepsia. Last seen at [facility] on 09/17/2022. Please see problem based assessment and plan for additional details.     Past Medical History:  Diagnosis Date   Anxiety and depression    History of hepatitis C    Review of Systems  Constitutional:  Negative for fever and weight loss.  Cardiovascular:  Negative for chest pain.  Gastrointestinal:  Positive for heartburn, nausea and vomiting. Negative for abdominal pain.  Genitourinary:  Negative for dysuria.     Objective:     BP (!) 154/98 (BP Location: Right Arm, Patient Position: Sitting, Cuff Size: Normal)   Pulse 61   Temp 98.1 F (36.7 C) (Oral)   Ht 5\' 2"  (1.575 m)   Wt 138 lb 12.8 oz (63 kg)   LMP  (LMP Unknown)   SpO2 98%   BMI 25.39 kg/m  BP Readings from Last 3 Encounters:  03/13/23 (!) 154/98  09/17/22 139/81  06/06/22 (!) 126/92   Wt Readings from Last 3 Encounters:  03/13/23 138 lb 12.8 oz (63 kg)  09/17/22 137 lb 4.8 oz (62.3 kg)  06/06/22 130 lb (59 kg)   Physical Exam Constitutional:      Appearance: Normal appearance.  HENT:     Head: Normocephalic and atraumatic.     Nose: Nose normal.     Mouth/Throat:     Mouth: Mucous membranes are moist.  Cardiovascular:     Rate and Rhythm: Normal rate and regular rhythm.  Pulmonary:     Effort: Pulmonary effort is normal.     Breath sounds: Normal breath sounds.  Abdominal:     General: Bowel sounds are normal.     Palpations: Abdomen is soft.     Tenderness: There is  abdominal tenderness.     Comments: Left abdominal tenderness on deep palpation  Musculoskeletal:        General: Normal range of motion.  Skin:    General: Skin is warm and dry.  Neurological:     General: No focal deficit present.     Mental Status: She is alert.  Psychiatric:        Mood and Affect: Mood normal.        Behavior: Behavior normal.    No results found for any visits on 03/13/23.  Last metabolic panel Lab Results  Component Value Date   GLUCOSE 79 08/21/2019   NA 140 08/21/2019   K 4.3 08/21/2019   CL 103 08/21/2019   CO2 25 08/21/2019   BUN 10 08/21/2019   CREATININE 0.80 08/21/2019   GFRNONAA 87 08/21/2019   CALCIUM 9.6 08/21/2019   PROT 6.6 05/11/2021   ALBUMIN 3.9 05/11/2021   LABGLOB 2.7 08/21/2019   AGRATIO 1.6 08/21/2019   BILITOT <0.2 05/11/2021   ALKPHOS 80 05/11/2021   AST 13 05/11/2021   ALT 11 05/11/2021   ANIONGAP 9 08/21/2014    The ASCVD  Risk score (Arnett DK, et al., 2019) failed to calculate for the following reasons:   Cannot find a previous HDL lab   Cannot find a previous total cholesterol lab    Assessment & Plan:   Problem List Items Addressed This Visit     Elevated blood pressure reading - Primary   Patient with elevated blood pressure readings at prior clinic visit. Today blood pressures are 153/92, 157/95, and 154/98 on repeat. Discussed importance of blood pressure control, patient willing to start antihypertensive therapy today. Will start with low dose amlodipine. Patient to keep BP log and return to clinic in 2-4 weeks for blood pressure check and medication adjustments as needed.  Plan - START amlodipine 5 mg daily  - Keep BP log at home, bring in to visit - Return to clinic in 2-4 weeks, adjust medication as needed; consider getting BMP and starting ACE/ARB       Dyspepsia   Patient with about 6 months of dyspepsia symptoms, including burning sensation in chest after meals, bloating, and gas. Patient endorses  recent worsening of symptoms including belching, and loose stools. Called after hours line and spoke to Dr. Ned Card, who recommended OTC imodium and protonix 40 mg daily for 30 days. Patient states she was not able to pick up medications due to cost. Is not currently taking anything for symptom relief. No red flag symptoms, such as weight loss, regurgitated foods, fever, or blood in stool/dark stools. Patient afebrile here. Endorses left sided abdominal pain with deep palpation, patient appears well hydrated.   Differential includes GERD and PUD. Patient willing to do 2-4 week trial of OTC PPI and OTC meds for symptomatic relief with close follow up in clinic. Agreeable to picking up OTC medications from [facility] pharmacy due to lower cost.  Plan - Omeprazole tablets 20 mg daily 30 minutes before breakfast  - Famotidine 20 mg once daily before bedtime - Simethicone three times a day after every meal and at bedtime - Return to clinic in ~1 month to help determine if there is improvement in symptoms or if there is a need to increase PPI dose/frequency and/or change PPI; consider GI referral and scoping if patient continues to endorse symptoms despite PPI therapy after 2-3 months          Patient discussed with Dr. Lafonda Mosses. Return in about 4 weeks (around 04/10/2023) for reflux and blood pressure.    Alicyn Klann Colbert Coyer, MD

## 2023-03-13 NOTE — Patient Instructions (Addendum)
Thank you, [name] for allowing [place] to provide your care today. Today we discussed your reflux symptoms, including indigestion, burning sensation after eating, and abdominal upset.    I have ordered the following labs for you:  Lab Orders  No laboratory test(s) ordered today     Tests ordered today:  None  Referrals ordered today:   Referral Orders  No referral(s) requested today     I have ordered the following medication/changed the following medications:   Stop the following medications: Medications Discontinued During This Encounter  Medication Reason   fluticasone (FLONASE) 50 MCG/ACT nasal spray Reorder     Start the following medications: Meds ordered this encounter  Medications   fluticasone (FLONASE) 50 MCG/ACT nasal spray    Sig: Place 2 sprays into both nostrils daily.    Dispense:  16 g    Refill:  0   amLODipine (NORVASC) 5 MG tablet    Sig: Take 1 tablet (5 mg total) by mouth daily.    Dispense:  30 tablet    Refill:  2     Follow up: 2-4 weeks for reflux and blood pressure follow up    Remember:   Please pick up the following medications over the counter at our [facility] ([address] [phone]):  - Omeprazole tablets 20 mg daily 30 minutes before breakfast $5.77 - Famotidine 20 mg once daily  - Simethicone three times a day after every meal and at bedtime   For blood pressure: You will start taking amlodipine 5 mg daily   Should you have any questions or concerns please call the internal medicine clinic at [phone].     Eamon Tantillo Colbert Coyer, MD PGY-1 Internal Medicine Teaching [facility]

## 2023-03-15 DIAGNOSIS — R1013 Epigastric pain: Secondary | ICD-10-CM | POA: Insufficient documentation

## 2023-03-15 DIAGNOSIS — R030 Elevated blood-pressure reading, without diagnosis of hypertension: Secondary | ICD-10-CM | POA: Insufficient documentation

## 2023-03-15 NOTE — Assessment & Plan Note (Addendum)
Patient with about 6 months of dyspepsia symptoms, including burning sensation in chest after meals, bloating, and gas. Patient endorses recent worsening of symptoms including belching, and loose stools. Called after hours line and spoke to Dr. Ned Card, who recommended OTC imodium and protonix 40 mg daily for 30 days. Patient states she was not able to pick up medications due to cost. Is not currently taking anything for symptom relief. No red flag symptoms, such as weight loss, regurgitated foods, fever, or blood in stool/dark stools. Patient afebrile here. Endorses left sided abdominal pain with deep palpation, patient appears well hydrated.   Differential includes GERD and PUD. Patient willing to do 2-4 week trial of OTC PPI and OTC meds for symptomatic relief with close follow up in clinic. Agreeable to picking up OTC medications from [facility] pharmacy due to lower cost.  Plan - Omeprazole tablets 20 mg daily 30 minutes before breakfast  - Famotidine 20 mg once daily before bedtime - Simethicone three times a day after every meal and at bedtime - Return to clinic in ~1 month to help determine if there is improvement in symptoms or if there is a need to increase PPI dose/frequency and/or change PPI; consider GI referral and scoping if patient continues to endorse symptoms despite PPI therapy after 2-3 months

## 2023-03-15 NOTE — Assessment & Plan Note (Signed)
Patient with elevated blood pressure readings at prior clinic visit. Today blood pressures are 153/92, 157/95, and 154/98 on repeat. Discussed importance of blood pressure control, patient willing to start antihypertensive therapy today. Will start with low dose amlodipine. Patient to keep BP log and return to clinic in 2-4 weeks for blood pressure check and medication adjustments as needed.  Plan - START amlodipine 5 mg daily  - Keep BP log at home, bring in to visit - Return to clinic in 2-4 weeks, adjust medication as needed; consider getting BMP and starting ACE/ARB

## 2023-03-18 NOTE — Progress Notes (Signed)
 Internal Medicine Clinic Attending  Case discussed with the resident at the time of the visit.  We reviewed the resident's history and exam and pertinent patient test results.  I agree with the assessment, diagnosis, and plan of care documented in the resident's note.

## 2023-04-04 ENCOUNTER — Encounter: Payer: 59 | Admitting: Student

## 2023-04-04 NOTE — Progress Notes (Deleted)
***       CC: Routine Follow Up for hypertension and dyspepsia after last office visit 03/13/2023  HPI:  [name] is a 53 y.o. female with pertinent PMH of HTN, anxiety/depression, and history of hepatitis C who presents as above. Please see assessment and plan below for further details.  Review of Systems:   Pertinent items noted in HPI and/or A&P.  Physical Exam:  There were no vitals filed for this visit.  Constitutional:***. In no acute distress. HEENT: Normocephalic, atraumatic, Sclera non-icteric, PERRL, EOM intact Cardio:Regular rate and rhythm. 2+ bilateral {PulseLoc:28294} pulses. Pulm:Clear to auscultation bilaterally. Normal work of breathing on room air. Abdomen: Soft, non-tender, non-distended, positive bowel sounds. LOV:FIEPPIRJ for extremity edema. Skin:Warm and dry. Neuro:Alert and oriented x3. No focal deficit noted. Psych:Pleasant mood and affect.   Assessment & Plan:   No problem-specific Assessment & Plan notes found for this encounter.    Patient {GC/GE:3044014::"discussed with","seen with"} Dr. {NAMES:3044014::"Lau","Guilloud","Hoffman","Machen","Chambliss","Winfrey","Williams","Vincent"}  Rocky Morel, DO [facility] Internal Medicine Resident PGY-2 Clinic Phone: [phone] Pager: [phone]

## 2023-04-09 ENCOUNTER — Encounter: Payer: 59 | Admitting: Student

## 2023-04-10 ENCOUNTER — Other Ambulatory Visit (HOSPITAL_COMMUNITY): Payer: Self-pay

## 2023-04-10 ENCOUNTER — Ambulatory Visit (INDEPENDENT_AMBULATORY_CARE_PROVIDER_SITE_OTHER): Payer: 59 | Admitting: Student

## 2023-04-10 ENCOUNTER — Encounter: Payer: Self-pay | Admitting: Student

## 2023-04-10 VITALS — BP 127/88 | HR 65 | Temp 98.7°F | Ht 62.0 in | Wt 134.6 lb

## 2023-04-10 DIAGNOSIS — R1013 Epigastric pain: Secondary | ICD-10-CM | POA: Diagnosis not present

## 2023-04-10 DIAGNOSIS — R030 Elevated blood-pressure reading, without diagnosis of hypertension: Secondary | ICD-10-CM

## 2023-04-10 DIAGNOSIS — Z139 Encounter for screening, unspecified: Secondary | ICD-10-CM

## 2023-04-10 MED ORDER — AMLODIPINE BESYLATE 5 MG PO TABS
5.0000 mg | ORAL_TABLET | Freq: Every day | ORAL | 3 refills | Status: DC
  Filled 2023-04-10: qty 30, 30d supply, fill #0

## 2023-04-10 MED ORDER — AMLODIPINE BESYLATE 5 MG PO TABS: 5.0000 mg | ORAL_TABLET | Freq: Every day | ORAL | 3 refills | Status: DC

## 2023-04-10 NOTE — Assessment & Plan Note (Addendum)
 BP Readings from Last 3 Encounters:  04/10/23 127/88  03/13/23 (!) 154/98  09/17/22 139/81    Patient presented to the office in January with a blood pressure of 154/98 despite repeat and was initiated on amlodipine 10 mg at that time.  Her blood pressure today is 127/88 and I wonder if her blood pressure in January was a white coat hypertension or not.  Patient was advised to measure her blood pressures at home and was provided a blood pressure log but she was not able to do that.  Patient seems to have tolerated amlodipine very well and denies any lower extremity swelling at this time.  Will continue patient on amlodipine 10 mg and have her recheck her blood pressure at home.  I stressed on the importance of home blood pressure readings to help better determine the right antihypertensive regimen and patient endorses full understanding at this time.  If this is true hypertension and  her subsequent blood pressures continues to remain at goal, consider continuing this monotherapy therapy if needed.  If her subsequent blood pressures continues to be above goal, consider adding a second agent at that time. -Continue amlodipine 10 mg -Follow-up on home blood pressure readings

## 2023-04-10 NOTE — Progress Notes (Signed)
 CC: Follow-up on blood pressure and dyspepsia  HPI:  [name] is a 53 y.o. female living with a history stated below and presents today for follow-up on blood pressure and dyspepsia and also discuss other chronic conditions. Please see problem based assessment and plan for additional details.  Past Medical History:  Diagnosis Date   Anxiety and depression    History of hepatitis C     Current Outpatient Medications on File Prior to Visit  Medication Sig Dispense Refill   fluticasone (FLONASE) 50 MCG/ACT nasal spray Place 2 sprays into both nostrils daily. 16 g 0   pantoprazole (PROTONIX) 40 MG tablet Take 1 tablet (40 mg total) by mouth daily. 30 tablet 0   No current facility-administered medications on file prior to visit.    Family History  Problem Relation Age of Onset   Cancer Other    Ovarian cancer Maternal Aunt        deceased    Social History   Socioeconomic History   Marital status: Single    Spouse name: Not on file   Number of children: Not on file   Years of education: Not on file   Highest education level: Not on file  Occupational History   Not on file  Tobacco Use   Smoking status: Every Day    Current packs/day: 0.50    Average packs/day: 0.5 packs/day for 30.0 years (15.0 ttl pk-yrs)    Types: Cigarettes   Smokeless tobacco: Never   Tobacco comments:    0.5 PPD  Vaping Use   Vaping status: Some Days  Substance and Sexual Activity   Alcohol use: No   Drug use: Not Currently    Types: Marijuana    Comment: denies   Sexual activity: Not on file  Other Topics Concern   Not on file  Social History Narrative   Not on file   Social Drivers of Health   Financial Resource Strain: Low Risk  (03/13/2023)   Overall Financial Resource Strain (CARDIA)    Difficulty of Paying Living Expenses: Not hard at all  Food Insecurity: No Food Insecurity (03/13/2023)   Hunger Vital Sign    Worried About Running Out of Food in the Last Year: Never true     Ran Out of Food in the Last Year: Never true  Transportation Needs: No Transportation Needs (03/13/2023)   PRAPARE - Administrator, Civil Service (Medical): No    Lack of Transportation (Non-Medical): No  Physical Activity: Not on file  Stress: No Stress Concern Present (03/13/2023)   [employer] of Occupational Health - Occupational Stress Questionnaire    Feeling of Stress : Only a little  Social Connections: Socially Isolated (03/13/2023)   Social Connection and Isolation Panel [NHANES]    Frequency of Communication with Friends and Family: More than three times a week    Frequency of Social Gatherings with Friends and Family: More than three times a week    Attends Religious Services: Never    Database administrator or Organizations: No    Attends Banker Meetings: Never    Marital Status: Separated  Intimate Partner Violence: Not At Risk (03/13/2023)   Humiliation, Afraid, Rape, and Kick questionnaire    Fear of Current or Ex-Partner: No    Emotionally Abused: No    Physically Abused: No    Sexually Abused: No    Review of Systems: ROS negative except for what is noted on the assessment  and plan.  Vitals:   04/10/23 1545  BP: 127/88  Pulse: 65  Temp: 98.7 F (37.1 C)  TempSrc: Oral  SpO2: 99%  Weight: 134 lb 9.6 oz (61.1 kg)  Height: 5\' 2"  (1.575 m)    Physical Exam: Constitutional: well-appearing woman, sitting in chair, in no acute distress Cardiovascular: regular rate and rhythm, no m/r/g Pulmonary/Chest: normal work of breathing on room air, lungs clear to auscultation bilaterally Abdominal: soft, non-tender, non-distended MSK: normal bulk and tone Neurological: alert & oriented x 3, no focal deficit Skin: warm and dry Psych: Circumstantial speech but goal oriented   Assessment & Plan:   Encounter for health-related screening Due for routine mammogram and colon cancer screening - Ambulatory referral to GI for colonoscopy -  Ambulatory referral to GI imaging for mammogram  Dyspepsia Symptoms improved on Protonix. Patient has been  taking Protonix for the past 4 weeks. Will continue Protonix for the next 4 weeks and withdraw it at that time to assess her symptoms. If her symptoms persist after withdrawing Protonix after 4 weeks, consider switching her to omeprazole and counsel of dietary modifications.  - Continue Protonix for the next 4 weeks -Follow-up in 4 weeks  Elevated blood pressure reading BP Readings from Last 3 Encounters:  04/10/23 127/88  03/13/23 (!) 154/98  09/17/22 139/81    Patient presented to the office in January with a blood pressure of 154/98 despite repeat and was initiated on amlodipine 10 mg at that time.  Her blood pressure today is 127/88 and I wonder if her blood pressure in January was a white coat hypertension or not.  Patient was advised to measure her blood pressures at home and was provided a blood pressure log but she was not able to do that.  Patient seems to have tolerated amlodipine very well and denies any lower extremity swelling at this time.  Will continue patient on amlodipine 10 mg and have her recheck her blood pressure at home.  I stressed on the importance of home blood pressure readings to help better determine the right antihypertensive regimen and patient endorses full understanding at this time.  If this is true hypertension and  her subsequent blood pressures continues to remain at goal, consider continuing this monotherapy therapy if needed.  If her subsequent blood pressures continues to be above goal, consider adding a second agent at that time. -Continue amlodipine 10 mg -Follow-up on home blood pressure readings    Patient discussed with Dr. Dale Cesar Chavez, M.D [facility] Phone: [phone] Date 04/10/2023 Time 5:05 PM

## 2023-04-10 NOTE — Patient Instructions (Addendum)
 Thank you, Ms.Delpha Leighton for allowing [place] to provide your care today. Today we discussed your elevated blood pressure and acid reflux. I am glad to hear your reflux has improved on the medication.I we will keep you on the Protonix for 4 more weeks and take you off it to see if your symptoms still persist.  Your blood pressure is well controlled on  Amlodipine and you are not having any leg swelling. For your migraines, please try to avoid trigger for now. If you continue to have multiple episodes weekly, please let us know and we will discuss available medical therapies.   I have ordered the following labs for you:  Lab Orders  No laboratory test(s) ordered today     Tests ordered today:    Referrals ordered today:   Referral Orders         Ambulatory referral to Gastroenterology       I have ordered the following medication/changed the following medications:   Stop the following medications: Medications Discontinued During This Encounter  Medication Reason   buPROPion (WELLBUTRIN SR) 150 MG 12 hr tablet Discontinued by provider   buPROPion (WELLBUTRIN XL) 150 MG 24 hr tablet Discontinued by provider   naproxen (NAPROSYN) 500 MG tablet Discontinued by provider   triamcinolone (KENALOG) 0.025 % ointment Discontinued by provider   amLODipine (NORVASC) 5 MG tablet Reorder   amLODipine (NORVASC) 5 MG tablet      Start the following medications: Meds ordered this encounter  Medications   DISCONTD: amLODipine (NORVASC) 5 MG tablet    Sig: Take 1 tablet (5 mg total) by mouth daily.    Dispense:  60 tablet    Refill:  3   amLODipine (NORVASC) 5 MG tablet    Sig: Take 1 tablet (5 mg total) by mouth daily.    Dispense:  60 tablet    Refill:  3     Follow up: 3 months   Remember:   Should you have any questions or concerns please call the internal medicine clinic at [phone].    Kathleen Lime, M.D [facility]

## 2023-04-10 NOTE — Assessment & Plan Note (Signed)
 Symptoms improved on Protonix. Patient has been  taking Protonix for the past 4 weeks. Will continue Protonix for the next 4 weeks and withdraw it at that time to assess her symptoms. If her symptoms persist after withdrawing Protonix after 4 weeks, consider switching her to omeprazole and counsel of dietary modifications.  - Continue Protonix for the next 4 weeks -Follow-up in 4 weeks

## 2023-04-10 NOTE — Assessment & Plan Note (Signed)
 Due for routine mammogram and colon cancer screening - Ambulatory referral to GI for colonoscopy - Ambulatory referral to GI imaging for mammogram

## 2023-04-14 NOTE — Progress Notes (Signed)
 Internal Medicine Clinic Attending  Case discussed with the resident at the time of the visit.  We reviewed the resident's history and exam and pertinent patient test results.  I agree with the assessment, diagnosis, and plan of care documented in the resident's note.

## 2023-04-15 ENCOUNTER — Other Ambulatory Visit (HOSPITAL_COMMUNITY): Payer: Self-pay

## 2023-04-15 ENCOUNTER — Telehealth: Payer: Self-pay | Admitting: *Deleted

## 2023-04-15 NOTE — Telephone Encounter (Signed)
 Mammo diagnostic and [place] scheduled for March 28.2025 @ 11:30 to arrive 11:15 am patient aware of the change in the appointment. Appointment mailed to the patient.Marland Kitchen

## 2023-04-15 NOTE — Telephone Encounter (Signed)
 Mammogram appointment March 28.2025 @ 1:30 p / arruve 1:10 pm . Patient is aware of the 75.55 no show fee. If unable to keep appointment or needing to reschedule patient is to contact breast center [phone]. Appointment also mailed to the patient.

## 2023-04-16 ENCOUNTER — Other Ambulatory Visit (HOSPITAL_COMMUNITY): Payer: Self-pay

## 2023-05-01 ENCOUNTER — Ambulatory Visit (AMBULATORY_SURGERY_CENTER)

## 2023-05-01 VITALS — Ht 62.0 in | Wt 130.0 lb

## 2023-05-01 DIAGNOSIS — Z1211 Encounter for screening for malignant neoplasm of colon: Secondary | ICD-10-CM

## 2023-05-01 MED ORDER — SUFLAVE 178.7 G PO SOLR
1.0000 | Freq: Once | ORAL | 0 refills | Status: AC
Start: 2023-05-01 — End: 2023-05-01

## 2023-05-01 NOTE — Progress Notes (Signed)
 No egg or soy allergy known to patient  No issues known to pt with past sedation with any surgeries or procedures Patient denies ever being told they had issues or difficulty with intubation  No FH of Malignant Hyperthermia Pt is not on diet pills Pt is not on  home 02  Pt is not on blood thinners  Pt intermittent issues with constipation  No A fib or A flutter Have any cardiac testing pending--No Pt can ambulate  Pt denies use of chewing tobacco Discussed diabetic I weight loss medication holds Discussed NSAID holds Checked BMI Pt instructed to use Singlecare.com or GoodRx for a price reduction on prep  Patient's chart reviewed by Cathlyn Parsons CNRA prior to previsit and patient appropriate for the [facility].  Pre visit completed and red dot placed by patient's name on their procedure day (on provider's schedule).

## 2023-05-08 ENCOUNTER — Telehealth: Payer: Self-pay | Admitting: Pediatrics

## 2023-05-08 NOTE — Telephone Encounter (Signed)
 RN returned call to patient regarding not having received prep instructions for her colonoscopy scheduled for 05/16/23.Patient states [facility] contacted her and patient could not afford $50 so [facility] was going to change her prep medication to something else that she does not remember the name of. RN stated she will try to find out which medication will be sent and then send the appropriate instructions. RN asked patient to call back if she does not hear back from [place] first about which medication was sent so we can send her the appropriate instructions. Patient stated understanding.

## 2023-05-08 NOTE — Telephone Encounter (Signed)
 PT never received instructions and she has no access to her mychart to prepare for procedure on 4/3. Please advise.

## 2023-05-10 ENCOUNTER — Ambulatory Visit

## 2023-05-10 ENCOUNTER — Encounter

## 2023-05-10 ENCOUNTER — Other Ambulatory Visit

## 2023-05-15 NOTE — Progress Notes (Unsigned)
  Gastroenterology History and Physical   Primary Care Physician:  Colbert Coyer, Alyson Locket, MD   Reason for Procedure:  Colorectal cancer screening  Plan:    Screening colonoscopy   HPI: [name] is a 53 y.o. female undergoing screening colonoscopy for colorectal cancer screening.  This is the patient's first colonoscopy.  No family history of colorectal cancer or polyps.  Patient denies current symptoms of change in bowel habits or rectal bleeding.   Past Medical History:  Diagnosis Date   Anxiety and depression    COPD (chronic obstructive pulmonary disease) (HCC)    History of hepatitis C    Hypertension     Past Surgical History:  Procedure Laterality Date   AUGMENTATION MAMMAPLASTY Bilateral    BREAST ENHANCEMENT SURGERY     CESAREAN SECTION     MYRINGOTOMY      Prior to Admission medications   Medication Sig Start Date End Date Taking? Authorizing Provider  amLODipine (NORVASC) 5 MG tablet Take 1 tablet (5 mg total) by mouth daily. 04/10/23 04/09/24  Kathleen Lime, MD  fluticasone (FLONASE) 50 MCG/ACT nasal spray Place 2 sprays into both nostrils daily. 03/13/23   Philomena Doheny, MD  ibuprofen (ADVIL) 800 MG tablet Take 800 mg by mouth every 8 (eight) hours as needed. 11/14/22   [provider]  OVER THE COUNTER MEDICATION Take 1 packet by mouth daily as needed.    [provider]  pantoprazole (PROTONIX) 40 MG tablet Take 1 tablet (40 mg total) by mouth daily. Patient not taking: Reported on 05/01/2023 02/14/23 02/14/24  Chauncey Mann, DO    Current Outpatient Medications  Medication Sig Dispense Refill   amLODipine (NORVASC) 5 MG tablet Take 1 tablet (5 mg total) by mouth daily. 60 tablet 3   fluticasone (FLONASE) 50 MCG/ACT nasal spray Place 2 sprays into both nostrils daily. 16 g 0   ibuprofen (ADVIL) 800 MG tablet Take 800 mg by mouth every 8 (eight) hours as needed.     OVER THE COUNTER MEDICATION Take 1 packet by mouth daily as  needed.     pantoprazole (PROTONIX) 40 MG tablet Take 1 tablet (40 mg total) by mouth daily. (Patient not taking: Reported on 05/01/2023) 30 tablet 0   No current facility-administered medications for this visit.    Allergies as of 05/16/2023 - Review Complete 05/01/2023  Allergen Reaction Noted   Tylenol [acetaminophen] Other (See Comments) 08/09/2013    Family History  Problem Relation Age of Onset   Cancer Other    Ovarian cancer Maternal Aunt        deceased    Social History   Socioeconomic History   Marital status: Single    Spouse name: Not on file   Number of children: Not on file   Years of education: Not on file   Highest education level: Not on file  Occupational History   Not on file  Tobacco Use   Smoking status: Every Day    Current packs/day: 0.50    Average packs/day: 0.5 packs/day for 30.0 years (15.0 ttl pk-yrs)    Types: Cigarettes   Smokeless tobacco: Never   Tobacco comments:    0.5 PPD  Vaping Use   Vaping status: Former  Substance and Sexual Activity   Alcohol use: No   Drug use: Not Currently    Types: Marijuana    Comment: denies   Sexual activity: Not on file  Other Topics Concern   Not on file  Social  History Narrative   Not on file   Social Drivers of Health   Financial Resource Strain: Low Risk  (03/13/2023)   Overall Financial Resource Strain (CARDIA)    Difficulty of Paying Living Expenses: Not hard at all  Food Insecurity: No Food Insecurity (03/13/2023)   Hunger Vital Sign    Worried About Running Out of Food in the Last Year: Never true    Ran Out of Food in the Last Year: Never true  Transportation Needs: No Transportation Needs (03/13/2023)   PRAPARE - Administrator, Civil Service (Medical): No    Lack of Transportation (Non-Medical): No  Physical Activity: Not on file  Stress: No Stress Concern Present (03/13/2023)   [employer] of Occupational Health - Occupational Stress Questionnaire    Feeling of  Stress : Only a little  Social Connections: Socially Isolated (03/13/2023)   Social Connection and Isolation Panel [NHANES]    Frequency of Communication with Friends and Family: More than three times a week    Frequency of Social Gatherings with Friends and Family: More than three times a week    Attends Religious Services: Never    Database administrator or Organizations: No    Attends Banker Meetings: Never    Marital Status: Separated  Intimate Partner Violence: Not At Risk (03/13/2023)   Humiliation, Afraid, Rape, and Kick questionnaire    Fear of Current or Ex-Partner: No    Emotionally Abused: No    Physically Abused: No    Sexually Abused: No    Review of Systems:  All other review of systems negative except as mentioned in the HPI.  Physical Exam: Vital signs LMP  (LMP Unknown)   General:   Alert,  Well-developed, well-nourished, pleasant and cooperative in NAD Airway:  Mallampati  Lungs:  Clear throughout to auscultation.   Heart:  Regular rate and rhythm; no murmurs, clicks, rubs,  or gallops. Abdomen:  Soft, nontender and nondistended. Normal bowel sounds.   Neuro/Psych:  Normal mood and affect. A and O x 3  Maren Beach, MD [facility]

## 2023-05-16 ENCOUNTER — Encounter: Payer: Self-pay | Admitting: Pediatrics

## 2023-05-16 ENCOUNTER — Ambulatory Visit: Admitting: Pediatrics

## 2023-05-16 VITALS — BP 135/60 | HR 50 | Temp 98.2°F | Resp 12 | Ht 62.0 in | Wt 130.0 lb

## 2023-05-16 DIAGNOSIS — K635 Polyp of colon: Secondary | ICD-10-CM

## 2023-05-16 DIAGNOSIS — Z1211 Encounter for screening for malignant neoplasm of colon: Secondary | ICD-10-CM

## 2023-05-16 DIAGNOSIS — D122 Benign neoplasm of ascending colon: Secondary | ICD-10-CM | POA: Diagnosis not present

## 2023-05-16 DIAGNOSIS — K648 Other hemorrhoids: Secondary | ICD-10-CM

## 2023-05-16 DIAGNOSIS — D128 Benign neoplasm of rectum: Secondary | ICD-10-CM

## 2023-05-16 MED ORDER — SODIUM CHLORIDE 0.9 % IV SOLN
500.0000 mL | INTRAVENOUS | Status: DC
Start: 2023-05-16 — End: 2023-05-16

## 2023-05-16 NOTE — Op Note (Signed)
 [facility] Patient Name: [name] Procedure Date: 05/16/2023 4:19 PM MRN: [id number] Endoscopist: Maren Beach , MD, [id number] Age: 53 Referring MD:  Date of Birth: 09-15-1970 Gender: Female Account #: [id number] Procedure:                Colonoscopy Indications:              Screening for colorectal malignant neoplasm, This                            is the patient's first colonoscopy Medicines:                Monitored Anesthesia Care Procedure:                Pre-Anesthesia Assessment:                           - Prior to the procedure, a History and Physical                            was performed, and patient medications and                            allergies were reviewed. The patient's tolerance of                            previous anesthesia was also reviewed. The risks                            and benefits of the procedure and the sedation                            options and risks were discussed with the patient.                            All questions were answered, and informed consent                            was obtained. Prior Anticoagulants: The patient has                            taken no anticoagulant or antiplatelet agents. ASA                            Grade Assessment: II - A patient with mild systemic                            disease. After reviewing the risks and benefits,                            the patient was deemed in satisfactory condition to                            undergo the procedure.  After obtaining informed consent, the colonoscope                            was passed under direct vision. Throughout the                            procedure, the patient's blood pressure, pulse, and                            oxygen saturations were monitored continuously. The                            CF [id number] #[id number] was introduced through the anus                            and advanced to the cecum,  identified by                            appendiceal orifice and ileocecal valve. The                            colonoscopy was performed without difficulty. The                            patient tolerated the procedure well. The quality                            of the bowel preparation was adequate to identify                            polyps greater than 5 mm in size. The ileocecal                            valve, appendiceal orifice, and rectum were                            photographed. Scope In: 4:30:08 PM Scope Out: 4:50:03 PM Scope Withdrawal Time: 0 hours 14 minutes 54 seconds  Total Procedure Duration: 0 hours 19 minutes 55 seconds  Findings:                 The perianal and digital rectal examinations were                            normal. Pertinent negatives include normal                            sphincter tone and no palpable rectal lesions.                           A moderate amount of semi-liquid stool was found in                            the entire colon. Lavage of the area was performed  using a large amount of sterile water, resulting in                            clearance with adequate visualization.                           Two sessile polyps were found in the rectum and                            ascending colon. The polyps were 4 to 5 mm in size.                            These polyps were removed with a cold snare.                            Resection and retrieval were complete.                           Internal hemorrhoids were found during retroflexion. Complications:            No immediate complications. Estimated blood loss:                            Minimal. Estimated Blood Loss:     Estimated blood loss was minimal. Impression:               - Stool in the entire examined colon.                           - Two 4 to 5 mm polyps in the rectum and in the                            ascending colon, removed with a  cold snare.                            Resected and retrieved.                           - Internal hemorrhoids. Recommendation:           - Discharge patient to home (ambulatory).                           - Await pathology results.                           - Repeat colonoscopy for surveillance based on                            pathology results.                           - The findings and recommendations were discussed                            with the patient's family.                           -  Return to referring physician.                           - Patient has a contact number available for                            emergencies. The signs and symptoms of potential                            delayed complications were discussed with the                            patient. Return to normal activities tomorrow.                            Written discharge instructions were provided to the                            patient. Maren Beach, MD 05/16/2023 4:54:18 PM This report has been signed electronically.

## 2023-05-16 NOTE — Progress Notes (Signed)
 To pacu, VSS. Report to RN.tb

## 2023-05-16 NOTE — Patient Instructions (Signed)
Please read handouts provided Await pathology results.   YOU HAD AN ENDOSCOPIC PROCEDURE TODAY AT [facility]:   Refer to the procedure report that was given to you for any specific questions about what was found during the examination.  If the procedure report does not answer your questions, please call your gastroenterologist to clarify.  If you requested that your care partner not be given the details of your procedure findings, then the procedure report has been included in a sealed envelope for you to review at your convenience later.  YOU SHOULD EXPECT: Some feelings of bloating in the abdomen. Passage of more gas than usual.  Walking can help get rid of the air that was put into your GI tract during the procedure and reduce the bloating. If you had a lower endoscopy (such as a colonoscopy or flexible sigmoidoscopy) you may notice spotting of blood in your stool or on the toilet paper. If you underwent a bowel prep for your procedure, you may not have a normal bowel movement for a few days.  Please Note:  You might notice some irritation and congestion in your nose or some drainage.  This is from the oxygen used during your procedure.  There is no need for concern and it should clear up in a day or so.  SYMPTOMS TO REPORT IMMEDIATELY:  Following lower endoscopy (colonoscopy or flexible sigmoidoscopy):  Excessive amounts of blood in the stool  Significant tenderness or worsening of abdominal pains  Swelling of the abdomen that is new, acute  Fever of 100F or higher  For urgent or emergent issues, a gastroenterologist can be reached at any hour by calling [phone]. Do not use MyChart messaging for urgent concerns.    DIET:  We do recommend a small meal at first, but then you may proceed to your regular diet.  Drink plenty of fluids but you should avoid alcoholic beverages for 24 hours.  ACTIVITY:  You should plan to take it easy for the rest of today and you should  NOT DRIVE or use heavy machinery until tomorrow (because of the sedation medicines used during the test).    FOLLOW UP: Our staff will call the number listed on your records the next business day following your procedure.  We will call around 7:15- 8:00 am to check on you and address any questions or concerns that you may have regarding the information given to you following your procedure. If we do not reach you, we will leave a message.     If any biopsies were taken you will be contacted by phone or by letter within the next 1-3 weeks.  Please call us at [phone] if you have not heard about the biopsies in 3 weeks.    SIGNATURES/CONFIDENTIALITY: You and/or your care partner have signed paperwork which will be entered into your electronic medical record.  These signatures attest to the fact that that the information above on your After Visit Summary has been reviewed and is understood.  Full responsibility of the confidentiality of this discharge information lies with you and/or your care-partner.

## 2023-05-16 NOTE — Progress Notes (Signed)
 Called to room to assist during endoscopic procedure.  Patient ID and intended procedure confirmed with present staff. Received instructions for my participation in the procedure from the performing physician.

## 2023-05-17 ENCOUNTER — Telehealth: Payer: Self-pay

## 2023-05-17 NOTE — Telephone Encounter (Signed)
 Attempted to call patient back and had to leave a message on her voicemail. I advised t her to take a gas ex or some kind of gas pill for the retained gas, also gave her examples or how to help rid them. Advised her to call back if she had further questions or if it did not subside.

## 2023-05-17 NOTE — Telephone Encounter (Signed)
Attempted to reach patient for post-procedure f/u call. No answer. Left message for her to please not hesitate to call if she has any questions/concerns regarding her care. 

## 2023-05-17 NOTE — Telephone Encounter (Signed)
 Patient called and stated that her Stomach was hurting really bad since her procedure. Patient is requesting a call back. Please advise.

## 2023-05-21 ENCOUNTER — Telehealth: Payer: Self-pay | Admitting: *Deleted

## 2023-05-21 ENCOUNTER — Telehealth: Payer: Self-pay | Admitting: Pediatrics

## 2023-05-21 ENCOUNTER — Encounter: Payer: Self-pay | Admitting: Pediatrics

## 2023-05-21 LAB — SURGICAL PATHOLOGY

## 2023-05-21 NOTE — Telephone Encounter (Signed)
 Inbound call from patient stating that she got her results back on Mychart but is not able to log in to see them. Requesting a call from nurse to discuss results. Please advise.

## 2023-05-21 NOTE — Telephone Encounter (Signed)
 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  While tubular adenomas are a benign type of polyp they are considered precancerous in nature.  That means that this type of polyp could have turned into colon cancer had not been removed.  Hyperplastic polyps have no malignant potential and do not turn into colon cancer.   Based upon this finding, I recommend that you undergo your next surveillance colonoscopy in 7 years or sooner should symptoms warrant.

## 2023-05-21 NOTE — Telephone Encounter (Signed)
 Needs to be sent to [facility].

## 2023-05-21 NOTE — Telephone Encounter (Signed)
 Called and spoke with patient regarding pathology results as outlined below. Patient knows that recall colonoscopy will be due in 7 years, pt has been advised to contact us in the interim if she develops any new GI symptoms. Patient verbalized understanding and had no concerns at the end of the call.

## 2023-05-22 ENCOUNTER — Other Ambulatory Visit: Payer: Self-pay | Admitting: Student

## 2023-05-24 ENCOUNTER — Ambulatory Visit: Payer: Self-pay

## 2023-05-24 NOTE — Telephone Encounter (Signed)
 Copied from CRM [id number]. Topic: Clinical - Medical Advice >> May 24, 2023  8:56 AM Philippa Chester F wrote: Reason for CRM:   Patient is calling asking if there a procedure to see if you have throat cancer and the patient is requesting if there is a way she can be referred to have the procedure done. Patient stated she is not having any concerning symptoms just wanted to check based on a friend recently being diagnosed with throat cancer who is also a smoker like her.    Callback Number: [phone]

## 2023-05-24 NOTE — Telephone Encounter (Signed)
 Will forward to PCP

## 2023-05-29 ENCOUNTER — Telehealth: Payer: Self-pay | Admitting: *Deleted

## 2023-05-29 NOTE — Telephone Encounter (Signed)
 Patient NO SHOW/cancel  appointment for mammogram o  05-10-2023.

## 2023-06-05 ENCOUNTER — Other Ambulatory Visit

## 2023-06-05 ENCOUNTER — Telehealth: Payer: Self-pay | Admitting: *Deleted

## 2023-06-05 DIAGNOSIS — Z111 Encounter for screening for respiratory tuberculosis: Secondary | ICD-10-CM

## 2023-06-05 NOTE — Telephone Encounter (Unsigned)
 Copied from CRM [id number]. Topic: Clinical - Lab/Test Results >> Jun 05, 2023  1:34 PM Blair Bumpers wrote: Reason for CRM: Patient called stating that she needed to provide the nurse with an address on where to send TB test results to. She provided the address: [address] [place], [place], [place] [address].

## 2023-06-06 ENCOUNTER — Ambulatory Visit: Admitting: Student

## 2023-06-06 DIAGNOSIS — Z021 Encounter for pre-employment examination: Secondary | ICD-10-CM

## 2023-06-06 MED ORDER — FLUTICASONE PROPIONATE 50 MCG/ACT NA SUSP: 2.0000 | Freq: Every day | NASAL | 0 refills | Status: AC

## 2023-06-06 NOTE — Progress Notes (Addendum)
   I connected with  [name] on 06/06/2023 by telephone and verified that I am speaking with the correct person using two identifiers.   I discussed the limitations of evaluation and management by telemedicine. The patient expressed understanding and agreed to proceed.  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 The visit was conducted with the patient located at home and [name] at [facility]. The patient's identity was confirmed using their DOB and current address. The patient has consented to being evaluated through a telephone encounter and understands the associated risks (an examination cannot be done and the patient may need to come in for an appointment) / benefits (allows the patient to remain at home, decreasing exposure to coronavirus). I personally spent 20 minutes on medical discussion.   HPI:  [name] is a 53 y.o. with PMH as below.   Please see A&P for assessment of the patient's acute and chronic medical conditions.   Past Medical History:  Diagnosis Date   Allergy    Anxiety and depression    COPD (chronic obstructive pulmonary disease) (HCC)    History of hepatitis C    Hypertension    Review of Systems:  Seasonal allergy symptoms like cough, rhinitis. No fever or dyspnea.   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 Could not recall the exact name. 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 Interferon Gamma Release Assay obtained on 06/05/23 is negative and there are no concerns of latent or active disease.   This phone call was to help complete a staff health assessment/medical report from the [facility]  health and safety resource center. Discussed chronic problems, including seasonal allergies, tobacco use disorder, and history of anxiety and depression. The patient has not needed medication to help with her anxiety or depression since adopting her son. She uses an albuterol inhaler as needed as well as flonase  for seasonal allergies. Discussed the use of a non-sedating OTC allergy medication. Patient expressed interest in smoking cessation counseling at next clinic visit. At this time, the applicant does not have any physical condition or is under treatment that would limit their ability to work with children. Her medications are as needed for asthma/allergies and would not affect her ability to work with children. After reviewing the form with the patient, and as the patient's PCP, it is my opinion that she is emotionally and physically capable to care for children on a daily basis at this time. The completed form will be faxed/mailed to the appropriate address.    Patient discussed with Dr. Bettejane Brownie  Susan Gallicchio Arellano Zameza, MD [facility]  PGY-1 Phone: [phone] Date 06/10/2023  Time 8:28 AM

## 2023-06-07 DIAGNOSIS — Z021 Encounter for pre-employment examination: Secondary | ICD-10-CM | POA: Insufficient documentation

## 2023-06-07 NOTE — Assessment & Plan Note (Addendum)
 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 Could not recall the exact name. 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 Interferon Gamma Release Assay obtained on 06/05/23 is negative and there are no concerns of latent or active disease.   This phone call was to help complete a staff health assessment/medical report from the  child care health and safety resource center. Discussed chronic problems, including seasonal allergies, tobacco use disorder, and history of anxiety and depression. The patient has not needed medication to help with her anxiety or depression since adopting her son. She uses an albuterol inhaler as needed as well as flonase  for seasonal allergies. Discussed the use of a non-sedating OTC allergy medication. Patient expressed interest in smoking cessation counseling at next clinic visit. At this time, the applicant does not have any physical condition or is under treatment that would limit their ability to work with children. Her medications are as needed for asthma/allergies and would not affect her ability to work with children. After reviewing the form with the patient, and as the patient's PCP, it is my opinion that she is emotionally and physically capable to care for children on a daily basis at this time. The completed form will be faxed/mailed to the appropriate address.

## 2023-06-09 LAB — QUANTIFERON-TB GOLD PLUS
QuantiFERON Mitogen Value: >10 [IU]/mL
QuantiFERON Nil Value: 0.08 [IU]/mL
QuantiFERON TB1 Ag Value: 0.08 [IU]/mL
QuantiFERON TB2 Ag Value: 0.09 [IU]/mL
QuantiFERON-TB Gold Plus: NEGATIVE

## 2023-06-13 ENCOUNTER — Ambulatory Visit: Payer: Self-pay | Admitting: Student

## 2023-06-13 VITALS — BP 138/89 | HR 63 | Temp 97.8°F | Ht 62.0 in | Wt 129.5 lb

## 2023-06-13 DIAGNOSIS — L219 Seborrheic dermatitis, unspecified: Secondary | ICD-10-CM

## 2023-06-13 DIAGNOSIS — L989 Disorder of the skin and subcutaneous tissue, unspecified: Secondary | ICD-10-CM

## 2023-06-13 DIAGNOSIS — Z720 Tobacco use: Secondary | ICD-10-CM

## 2023-06-13 DIAGNOSIS — F17210 Nicotine dependence, cigarettes, uncomplicated: Secondary | ICD-10-CM

## 2023-06-13 DIAGNOSIS — F419 Anxiety disorder, unspecified: Secondary | ICD-10-CM

## 2023-06-13 DIAGNOSIS — F32A Depression, unspecified: Secondary | ICD-10-CM

## 2023-06-13 MED ORDER — NICOTINE 21 MG/24HR TD PT24: 21.0000 mg | MEDICATED_PATCH | TRANSDERMAL | 11 refills | Status: DC

## 2023-06-13 MED ORDER — KETOCONAZOLE 1 % EX SHAM: MEDICATED_SHAMPOO | CUTANEOUS | 2 refills | Status: DC

## 2023-06-13 MED ORDER — TRIAMCINOLONE ACETONIDE 0.025 % EX OINT: 1.0000 | TOPICAL_OINTMENT | Freq: Two times a day (BID) | CUTANEOUS | 0 refills | Status: AC

## 2023-06-13 MED ORDER — NICOTINE POLACRILEX 2 MG MT GUM: 2.0000 mg | CHEWING_GUM | OROMUCOSAL | 3 refills | Status: AC | PRN

## 2023-06-13 NOTE — Progress Notes (Signed)
 Established Patient Office Visit  Subjective   Patient ID: [name], female    DOB: 1970-09-29  Age: 53 y.o. MRN: [id number]  Chief Complaint  Patient presents with   Nicotine  Dependence    Smokes over 0.5 PPD  Failed Chantix  in the past (suicidal ideations)   Skin Problem    Painful "Bump" on scalp noticed 2 days ago Also c/o red marks, multiple locations (insect bites?)   Headache    Left side headache with ear pain     Anxiety    GAD=7  Always worrying about different things     HPI This is a 53 year old female living with a history stated below and presents today for follow up on smoking cessation and bump on her head. Please see problem based assessment and plan for additional details.   Past Medical History:  Diagnosis Date   Allergy    Anxiety and depression    COPD (chronic obstructive pulmonary disease) (HCC)    History of hepatitis C    Hypertension     ROS  As per assessment and plan  Objective:     BP 138/89 (BP Location: Left Arm, Patient Position: Sitting, Cuff Size: Normal)   Pulse 63   Temp 97.8 F (36.6 C) (Oral)   Ht 5\' 2"  (1.575 m)   Wt 129 lb 8 oz (58.7 kg)   LMP  (LMP Unknown)   SpO2 100%   BMI 23.69 kg/m  BP Readings from Last 3 Encounters:  06/13/23 138/89  05/16/23 135/60  04/10/23 127/88   Wt Readings from Last 3 Encounters:  06/13/23 129 lb 8 oz (58.7 kg)  05/16/23 130 lb (59 kg)  05/01/23 130 lb (59 kg)      Physical Exam  General: Sitting in chair, no acute distress Cardiovascular: Regular rate, no murmurs appreciated Pulmonary: Breathing comfortably, no wheezing or crackles Abdomen: Soft, nontender, nondistended, bowel sounds present MSK: Range of motion intact, no lower extremity edema Skin: +maculopapular lesions along her hand and arm +raised lesion with erythematous base and pus noted on the posterior L scalp, multiple of other red flaky lesions on her scalp        Assessment & Plan:  Patient is discussed  with Dr Sharia Daunt   Problem List Items Addressed This Visit       Musculoskeletal and Integument   Skin lesions, generalized - Primary   History of generalized itching and lesions, generalized. Reports that she itches her skin that leads her to picking on her skin and peeling it off. States that was previously given Kenalog  that helped. Per exam, few maculopapular lesion with erythematous base, no scales. No burrows noted.  - Kenalog  ointment sent       Seborrheic dermatitis       Reports that she noticed a bump on her head about two days ago, it was itchy leading to pain on the back of her scalp and long the neck. Per exam, she has a raised lesion with erythematous base and pus noted on the posterior L scalp and multiple of other red flaky lesions on her scalp.  - She is advised to apply warm compressors on the raised bump to allow it to drain - Advised to use Ketoconazole  shampoo 2-3 times a week         Other   Anxiety and depression (Chronic)   Reports that she has made changes in her life, she is presently not on any medications. Reports that since  adopting her son, she feels as if her mood is very stable.      Tobacco abuse (Chronic)   Presently smoking half a pack a day, reports that she is smoking more because of job stressors. She is ready to quit. States that she previously tried Chantix  over 10 years ago that gave her suicidal ideations. She has not tried gum or patches.  - Nicotine  transdermal patch, 24 hour - Nicotine  Gum  - RTC 1 month        Return for As needed per patient .    Lanney Pitts, DO

## 2023-06-13 NOTE — Patient Instructions (Addendum)
 Thank you, Ms.Rolando Suderman for allowing us  to provide your care today. Today we discussed:  Please look at the instructions for Nicotine  patches  Wash your hair with anti-fungal shampoo three times a week.   Referrals ordered today:   Referral Orders  No referral(s) requested today     I have ordered the following medication/changed the following medications:   Stop the following medications: There are no discontinued medications.   Start the following medications: Meds ordered this encounter  Medications   nicotine  (NICODERM CQ  - DOSED IN MG/24 HOURS) 21 mg/24hr patch    Sig: Place 1 patch (21 mg total) onto the skin daily.    Dispense:  30 patch    Refill:  11   nicotine  polacrilex (NICORETTE ) 2 MG gum    Sig: Take 1 each (2 mg total) by mouth as needed for smoking cessation.    Dispense:  100 tablet    Refill:  3   triamcinolone  (KENALOG ) 0.025 % ointment    Sig: Apply 1 Application topically 2 (two) times daily.    Dispense:  30 g    Refill:  0   KETOCONAZOLE , TOPICAL, 1 % SHAM    Sig: Wash your scalp 2-3 times a week    Dispense:  125 mL    Refill:  2     Follow up:  As needed per patient      Remember:   Should you have any questions or concerns please call the internal medicine clinic at [phone].     Lanney Pitts, DO [facility] '

## 2023-06-14 DIAGNOSIS — L219 Seborrheic dermatitis, unspecified: Secondary | ICD-10-CM | POA: Insufficient documentation

## 2023-06-14 NOTE — Assessment & Plan Note (Signed)
     Reports that she noticed a bump on her head about two days ago, it was itchy leading to pain on the back of her scalp and long the neck. Per exam, she has a raised lesion with erythematous base and pus noted on the posterior L scalp and multiple of other red flaky lesions on her scalp.  - She is advised to apply warm compressors on the raised bump to allow it to drain - Advised to use Ketoconazole  shampoo 2-3 times a week

## 2023-06-14 NOTE — Assessment & Plan Note (Signed)
 History of generalized itching and lesions, generalized. Reports that [name] itches her skin that leads her to picking on her skin and peeling it off. States that was previously given Kenalog  that helped. Per exam, few maculopapular lesion with erythematous base, no scales. No burrows noted.  - Kenalog  ointment sent

## 2023-06-14 NOTE — Assessment & Plan Note (Addendum)
 Reports that she has made changes in her life, she is presently not on any medications. Reports that since adopting her son, she feels as if her mood is very stable.

## 2023-06-14 NOTE — Assessment & Plan Note (Signed)
 Presently smoking half a pack a day, reports that she is smoking more because of job stressors. She is ready to quit. States that she previously tried Chantix  over 10 years ago that gave her suicidal ideations. She has not tried gum or patches.  - Nicotine  transdermal patch, 24 hour - Nicotine  Gum  - RTC 1 month

## 2023-06-15 NOTE — Progress Notes (Signed)
 Internal Medicine Clinic Attending  Case discussed with the resident at the time of the visit.  We reviewed the resident's history and exam and pertinent patient test results.  I agree with the assessment, diagnosis, and plan of care documented in the resident's note.

## 2023-06-17 ENCOUNTER — Emergency Department (HOSPITAL_BASED_OUTPATIENT_CLINIC_OR_DEPARTMENT_OTHER)
Admission: EM | Admit: 2023-06-17 | Discharge: 2023-06-17 | Disposition: A | Discharge status: Home / Self Care | Attending: Emergency Medicine | Admitting: Emergency Medicine

## 2023-06-17 ENCOUNTER — Other Ambulatory Visit (HOSPITAL_BASED_OUTPATIENT_CLINIC_OR_DEPARTMENT_OTHER): Payer: Self-pay

## 2023-06-17 ENCOUNTER — Emergency Department (HOSPITAL_BASED_OUTPATIENT_CLINIC_OR_DEPARTMENT_OTHER)

## 2023-06-17 ENCOUNTER — Ambulatory Visit: Payer: Self-pay | Admitting: *Deleted

## 2023-06-17 ENCOUNTER — Encounter (HOSPITAL_BASED_OUTPATIENT_CLINIC_OR_DEPARTMENT_OTHER): Payer: Self-pay | Admitting: Emergency Medicine

## 2023-06-17 ENCOUNTER — Other Ambulatory Visit: Payer: Self-pay

## 2023-06-17 DIAGNOSIS — R519 Headache, unspecified: Secondary | ICD-10-CM | POA: Insufficient documentation

## 2023-06-17 DIAGNOSIS — R599 Enlarged lymph nodes, unspecified: Secondary | ICD-10-CM | POA: Diagnosis not present

## 2023-06-17 DIAGNOSIS — L989 Disorder of the skin and subcutaneous tissue, unspecified: Secondary | ICD-10-CM | POA: Insufficient documentation

## 2023-06-17 LAB — CBC WITH DIFFERENTIAL/PLATELET
Abs Immature Granulocytes: 0.03 10*3/uL (ref 0.00–0.07)
Basophils Absolute: 0 10*3/uL (ref 0.0–0.1)
Basophils Relative: 0 %
Eosinophils Absolute: 0.1 10*3/uL (ref 0.0–0.5)
Eosinophils Relative: 1 %
HCT: 40.8 % (ref 36.0–46.0)
Hemoglobin: 13.9 g/dL (ref 12.0–15.0)
Immature Granulocytes: 0 %
Lymphocytes Relative: 34 %
Lymphs Abs: 3.3 10*3/uL (ref 0.7–4.0)
MCH: 30 pg (ref 26.0–34.0)
MCHC: 34.1 g/dL (ref 30.0–36.0)
MCV: 88.1 fL (ref 80.0–100.0)
Monocytes Absolute: 0.6 10*3/uL (ref 0.1–1.0)
Monocytes Relative: 6 %
Neutro Abs: 5.8 10*3/uL (ref 1.7–7.7)
Neutrophils Relative %: 59 %
Platelets: 236 10*3/uL (ref 150–400)
RBC: 4.63 MIL/uL (ref 3.87–5.11)
RDW: 14.2 % (ref 11.5–15.5)
WBC: 9.8 10*3/uL (ref 4.0–10.5)
nRBC: 0 % (ref 0.0–0.2)

## 2023-06-17 LAB — BASIC METABOLIC PANEL WITH GFR
Anion gap: 10 (ref 5–15)
BUN: 13 mg/dL (ref 6–20)
CO2: 26 mmol/L (ref 22–32)
Calcium: 9.5 mg/dL (ref 8.9–10.3)
Chloride: 103 mmol/L (ref 98–111)
Creatinine, Ser: 0.74 mg/dL (ref 0.44–1.00)
GFR, Estimated: >60 mL/min (ref >60)
Glucose, Bld: 91 mg/dL (ref 70–99)
Potassium: 3.8 mmol/L (ref 3.5–5.1)
Sodium: 139 mmol/L (ref 135–145)

## 2023-06-17 MED ORDER — OXYCODONE HCL 5 MG PO TABS
5.0000 mg | ORAL_TABLET | Freq: Once | ORAL | Status: AC
  Administered 2023-06-17: 5 mg via ORAL
  Filled 2023-06-17: qty 1

## 2023-06-17 MED ORDER — OXYCODONE HCL 5 MG PO TABS
5.0000 mg | ORAL_TABLET | Freq: Four times a day (QID) | ORAL | 0 refills | Status: AC | PRN
  Filled 2023-06-17: qty 8, 2d supply, fill #0

## 2023-06-17 MED ORDER — DOXYCYCLINE HYCLATE 100 MG PO CAPS
100.0000 mg | ORAL_CAPSULE | Freq: Two times a day (BID) | ORAL | 0 refills | Status: AC
  Filled 2023-06-17: qty 14, 7d supply, fill #0

## 2023-06-17 MED ORDER — DOXYCYCLINE HYCLATE 100 MG PO TABS
100.0000 mg | ORAL_TABLET | Freq: Once | ORAL | Status: AC
  Administered 2023-06-17: 100 mg via ORAL
  Filled 2023-06-17: qty 1

## 2023-06-17 NOTE — ED Provider Notes (Signed)
 [facility] Provider Note   CSN: [id number] Arrival date & time: 06/17/23  1200     History  Chief Complaint  Patient presents with   Facial Swelling    [name] is a 53 y.o. female.  Patient sent from outside urgent care to rule out mastoiditis.  Patient developed a left sided occipital pustule about a week ago.  This areas been very tender.  She saw PCP on 06/13/2023.  She was started on treatment for seborrheic dermatitis.  This included ketoconazole  shampoo and warm compresses (although has been unable to get the shampoo).  Patient has had increasing pain that is now spread to behind the ear and to the back of the head and down to the shoulder.  No fevers.  No nausea vomiting or diarrhea.  She is treating at home with BC powder.       Home Medications Prior to Admission medications   Medication Sig Start Date End Date Taking? Authorizing Provider  fluticasone  (FLONASE ) 50 MCG/ACT nasal spray Place 2 sprays into both nostrils daily. 06/06/23   Arellano Zameza, Priscila, MD  ibuprofen  (ADVIL ) 800 MG tablet Take 800 mg by mouth every 8 (eight) hours as needed. 11/14/22   [provider]  KETOCONAZOLE , TOPICAL, 1 % SHAM Wash your scalp 2-3 times a week 06/13/23   Tawkaliyar, Roya, DO  nicotine  (NICODERM CQ  - DOSED IN MG/24 HOURS) 21 mg/24hr patch Place 1 patch (21 mg total) onto the skin daily. 06/13/23 06/12/24  Tawkaliyar, Roya, DO  nicotine  polacrilex (NICORETTE ) 2 MG gum Take 1 each (2 mg total) by mouth as needed for smoking cessation. 06/13/23 07/13/23  Tawkaliyar, Roya, DO  OVER THE COUNTER MEDICATION Take 1 packet by mouth daily as needed.    [provider]  triamcinolone  (KENALOG ) 0.025 % ointment Apply 1 Application topically 2 (two) times daily. 06/13/23   Tawkaliyar, Roya, DO      Allergies    Tylenol  [acetaminophen ] and Chantix  [varenicline ]    Review of Systems   Review of Systems  Physical Exam Updated Vital  Signs BP (!) 132/95 (BP Location: Left Arm)   Pulse 62   Temp 97.9 F (36.6 C) (Oral)   Resp 20   Ht 5\' 2"  (1.575 m)   Wt 56.7 kg   LMP  (LMP Unknown)   SpO2 98%   BMI 22.86 kg/m  Physical Exam Vitals and nursing note reviewed.  Constitutional:      Appearance: She is well-developed.  HENT:     Head: Normocephalic.     Comments: There is a dime sized pustule noted to the left occipital area with associated postauricular and occipital lymphadenopathy which is very tender to palpation.  This extends to the posterior cervical chain as well.    Right Ear: Tympanic membrane, ear canal and external ear normal.     Left Ear: Tympanic membrane, ear canal and external ear normal.     Nose: Nose normal.     Mouth/Throat:     Mouth: Mucous membranes are moist.  Eyes:     Conjunctiva/sclera: Conjunctivae normal.  Pulmonary:     Effort: No respiratory distress.  Musculoskeletal:     Cervical back: Normal range of motion and neck supple.  Lymphadenopathy:     Head:     Left side of head: Posterior auricular and occipital adenopathy present.     Cervical: Cervical adenopathy present.     Left cervical: Posterior cervical adenopathy present.  Skin:    General: Skin is warm and dry.  Neurological:     Mental Status: She is alert.     ED Results / Procedures / Treatments   Labs (all labs ordered are listed, but only abnormal results are displayed) Labs Reviewed  CBC WITH DIFFERENTIAL/PLATELET  BASIC METABOLIC PANEL WITH GFR    EKG None  Radiology CT Head Wo Contrast Result Date: 06/17/2023 CLINICAL DATA:  Headache, evaluate for mastoiditis. EXAM: CT HEAD WITHOUT CONTRAST TECHNIQUE: Contiguous axial images were obtained from the base of the skull through the vertex without intravenous contrast. RADIATION DOSE REDUCTION: This exam was performed according to the departmental dose-optimization program which includes automated exposure control, adjustment of the mA and/or kV according  to patient size and/or use of iterative reconstruction technique. COMPARISON:  Head CT 08/21/2014, report only. FINDINGS: Brain: No evidence of acute infarction, hemorrhage, hydrocephalus, extra-axial collection or mass lesion/mass effect. Vascular: No hyperdense vessel or unexpected calcification. Skull: Normal. Negative for fracture or focal lesion. Sinuses/Orbits: No acute finding. Other: None. IMPRESSION: No acute intracranial abnormality. Electronically Signed   By: Tyron Gallon M.D.   On: 06/17/2023 15:54    Procedures Procedures    Medications Ordered in ED Medications  doxycycline  (VIBRA -TABS) tablet 100 mg (100 mg Oral Given 06/17/23 1447)  oxyCODONE  (Oxy IR/ROXICODONE ) immediate release tablet 5 mg (5 mg Oral Given 06/17/23 1447)    ED Course/ Medical Decision Making/ A&P    Patient seen and examined. History obtained directly from patient.   Labs/EKG: Ordered CBC, BMP.  Imaging: Ordered CT head.  Medications/Fluids: Ordered: P.o. doxycycline , oxycodone .   Most recent vital signs reviewed and are as follows: BP (!) 132/95 (BP Location: Left Arm)   Pulse 62   Temp 97.9 F (36.6 C) (Oral)   Resp 20   Ht 5\' 2"  (1.575 m)   Wt 56.7 kg   LMP  (LMP Unknown)   SpO2 98%   BMI 22.86 kg/m   Initial impression: Pustule of scalp with reactive lymphadenopathy.  Patient sent to rule out mastoiditis.  4:27 PM Reassessment performed. Patient appears stable, comfortable.  Labs personally reviewed and interpreted including: CBC and BMP are unremarkable.  Imaging personally visualized and interpreted including: CT head, gree negative.  Reviewed pertinent lab work and imaging with patient at bedside. Questions answered.   Most current vital signs reviewed and are as follows: BP (!) 132/95 (BP Location: Left Arm)   Pulse 62   Temp 97.9 F (36.6 C) (Oral)   Resp 20   Ht 5\' 2"  (1.575 m)   Wt 56.7 kg   LMP  (LMP Unknown)   SpO2 98%   BMI 22.86 kg/m   Plan: Discharge to home.    Prescriptions written for: Doxycycline , oxycodone  5 mg # 8 tablets  Other home care instructions discussed: Warm compresses  ED return instructions discussed: The patient was urged to return to the Emergency Department urgently with worsening pain, swelling, expanding erythema especially if it streaks away from the affected area, fever, or if they have any other concerns.   The patient verbalized understanding and stated agreement with this plan.   Follow-up instructions discussed: Patient encouraged to follow-up with their PCP in 5 days.                                 Medical Decision Making Amount and/or Complexity of Data Reviewed Labs: ordered. Radiology: ordered.  Risk  Prescription drug management.   Patient with left occipital scalp pustule with reactive lymphadenopathy.  No evidence of mastoiditis.  Patient does not look septic.  Given extent of symptoms, feel that antibiotics are warranted.  CT was performed without other abnormalities.        Final Clinical Impression(s) / ED Diagnoses Final diagnoses:  Lesion of skin of scalp  Reactive lymphadenopathy    Rx / DC Orders ED Discharge Orders     None         Lyna Sandhoff, PA-C 06/17/23 1629    Albertus Hughs, DO 06/19/23 [phone]

## 2023-06-17 NOTE — Telephone Encounter (Signed)
 Called patient to review sx of bump on head. Patient currently listed in ED. No answer, LVMTCB if needed, [phone].

## 2023-06-17 NOTE — Discharge Instructions (Signed)
 Please read and follow all provided instructions.  Your diagnoses today include:  1. Lesion of skin of scalp   2. Reactive lymphadenopathy     Tests performed today include: Vital signs. See below for your results today.  Complete blood cell count: Was normal Basic metabolic panel: Was normal CT of your head: No sign of mastoiditis or significant problems  Medications prescribed:  Doxycycline  - antibiotic  You have been prescribed an antibiotic medicine: take the entire course of medicine even if you are feeling better. Stopping early can cause the antibiotic not to work.  Oxycodone  - narcotic pain medication  DO NOT drive or perform any activities that require you to be awake and alert because this medicine can make you drowsy.   Take any prescribed medications only as directed.   Home care instructions:  Follow any educational materials contained in this packet  Follow-up instructions: Please follow-up with your primary care provider in the next 5 days for further evaluation of your symptoms.   Return instructions:  Return to the Emergency Department if you have: Fever Worsening symptoms Worsening pain Worsening swelling Redness of the skin that moves away from the affected area, especially if it streaks away from the affected area  Any other emergent concerns  Your vital signs today were: BP (!) 132/95 (BP Location: Left Arm)   Pulse 62   Temp 97.9 F (36.6 C) (Oral)   Resp 20   Ht 5\' 2"  (1.575 m)   Wt 56.7 kg   LMP  (LMP Unknown)   SpO2 98%   BMI 22.86 kg/m  If your blood pressure (BP) was elevated above 135/85 this visit, please have this repeated by your doctor within one month. --------------

## 2023-06-17 NOTE — Telephone Encounter (Signed)
 Patient called, left VM to return the call to the office to speak to NT.   Looks like Pt is currently still in the ED.   Copied From CRM [id number]. Reason for Triage: Patient states she has a bump on her head for some time now. She states she is taking 5-6 BC powders a day to relieve the discomfort, the bump is not popping either. Patients call back # is [phone]

## 2023-06-17 NOTE — ED Triage Notes (Signed)
 Pt POV- reports swelling to L head that progresses down L neck x 1 week. Denies known fever, no known dental issues. Tolerating po intake. Reports pain radiates from head down through shoulder, worse with movement.  Last taken [facility] powder last night.

## 2023-06-17 NOTE — Progress Notes (Signed)
 Internal Medicine Clinic Attending  Case discussed with the resident at the time of the visit.  We reviewed the resident's history and exam and pertinent patient test results.  I agree with the assessment, diagnosis, and plan of care documented in the resident's note.

## 2023-06-17 NOTE — ED Notes (Signed)
 Pt alert and oriented X 4 at the time of discharge. RR even and unlabored. No acute distress noted. Pt verbalized understanding of discharge instructions as discussed. Pt ambulatory to lobby at time of discharge.

## 2023-06-24 NOTE — Telephone Encounter (Signed)
 Unable to speak with the pt. No answer.   Mammogram order has been placed and has been in Epic since 09/2022 and the [facility] has already called and [facility] per notes below.  01/09/2023: 1ST ATTEMPT// PT ANSWERED AND HUNG UP-NO SHOW-AJ   The pt will need to call the following number to schedule her mammogram @ [phone] if she has any questions.   [facility]  Medical diagnostic imaging center in [place], [place]   Located in: [facility] Address: [address], [place], [place] [address] Phone: [phone]    Copied from CRM [id number]. Topic: Clinical - Request for Lab/Test Order >> Jun 24, 2023 11:43 AM Retta Caster wrote: Reason for CRM: patient needs to reschedule MAM but I show no order. Needs order put in and call back to schedule MAM. [phone]

## 2023-07-12 ENCOUNTER — Ambulatory Visit: Payer: Self-pay

## 2023-07-12 NOTE — Telephone Encounter (Signed)
 Copied from CRM [id number]. Topic: Clinical - Red Word Triage >> Jul 12, 2023  1:33 PM Lenon Radar A wrote: Red Word that prompted transfer to Nurse Triage: White small dots on both eyelids, itchy, swelling, red  Chief Complaint: white small dots on both eyelids, itchy and red Symptoms: see above Frequency: constant Pertinent Negatives: Patient denies fever, rash, cp, sob Disposition: [] ED /[x] Urgent Care (no appt availability in office) / [] Appointment(In office/virtual)/ []  [place] [facility]/ [] Home Care/ [] Refused Recommended Disposition /[] [place] [facility]/ []  Follow-up with PCP Additional Notes: instructed to go to UC due to no apts available.  Care advice given, denies questions; instructed to go to ER if becomes worse.   Reason for Disposition  [1] MILD eyelid swelling (puffiness) AND [2] persists > 3 days  (Exception: Suspect mosquito bites.)  Answer Assessment - Initial Assessment Questions 1. ONSET: "When did the swelling start?" (e.g., minutes, hours, days)     States small white dots on both eyelids, itchy and red 2. LOCATION: "What part of the eyelids is swollen?"     Denies swelling 3. SEVERITY: "How swollen is it?"     Red and itchy 4. ITCHING: "Is there any itching?" If Yes, ask: "How much?"   (Scale 1-10; mild, moderate or severe)     mild 5. PAIN: "Is the swelling painful to touch?" If Yes, ask: "How painful is it?"   (Scale 1-10; mild, moderate or severe)     denies 6. FEVER: "Do you have a fever?" If Yes, ask: "What is it, how was it measured, and when did it start?"      no 7. CAUSE: "What do you think is causing the swelling?"     Unknown 8. RECURRENT SYMPTOM: "Have you had eyelid swelling before?" If Yes, ask: "When was the last time?" "What happened that time?"     no 9. OTHER SYMPTOMS: "Do you have any other symptoms?" (e.g., blurred vision, eye discharge, rash, runny nose)     Runny nose.  10. PREGNANCY: "Is there any chance you are pregnant?" "When was  your last menstrual period?"       na  Protocols used: Eye - Swelling-A-AH

## 2023-08-06 ENCOUNTER — Ambulatory Visit
Admission: RE | Admit: 2023-08-06 | Discharge: 2023-08-06 | Disposition: A | Discharge status: Home / Self Care | Source: Ambulatory Visit | Attending: Internal Medicine | Admitting: Internal Medicine

## 2023-08-06 ENCOUNTER — Telehealth: Payer: Self-pay | Admitting: *Deleted

## 2023-08-06 ENCOUNTER — Ambulatory Visit

## 2023-08-06 DIAGNOSIS — N644 Mastodynia: Secondary | ICD-10-CM

## 2023-08-06 NOTE — Telephone Encounter (Signed)
 Wants screening for throat and stomach cancer since someone in her family just succumbed to something similar. She denies difficulty swallowing. No persistent sore throat. History of heartburn but no history of H. Pylori. Recommend addressing at follow-up, she has an appointment in a little over 1 week.  Ozell Kung MD 08/06/2023, 5:30 PM

## 2023-08-06 NOTE — Telephone Encounter (Unsigned)
 Copied from CRM [id number]. Topic: Clinical - Medical Advice >> Aug 06, 2023 11:32 AM Mercer PEDLAR wrote: Reason for CRM: Patient calling to ask if there is any test for throat cancer because she has a family member who had it and passed away recently, also wondering about stomach cancer. She would like a callback. [phone]

## 2023-08-06 NOTE — Telephone Encounter (Signed)
 RTC to patient requesting referral to Dermatology.  Patient stated had not talked with doctor here about.  Informed patient that she will need to see 1 of the doctors prior to getting the referral. Agreed and will transferred to the front office to schedule an appointment. Copied from CRM [id number]. Topic: Referral - Request for Referral >> Aug 06, 2023  2:26 PM [name] wrote: Did the patient discuss referral with their provider in the last year? No, she can't remember (If No - schedule appointment) (If Yes - send message)  Appointment offered? No  Type of order/referral and detailed reason for visit: Dermatologist  Preference of office, provider, location: Which ever one she can be referred to  If referral order, have you been seen by this specialty before? No (If Yes, this issue or another issue? When? Where?  Can we respond through MyChart? Yes

## 2023-08-14 ENCOUNTER — Encounter

## 2023-08-23 ENCOUNTER — Ambulatory Visit: Payer: Self-pay

## 2023-08-23 NOTE — Telephone Encounter (Signed)
 FYI Only or Action Required?: FYI only for provider.  Patient was last seen in primary care on 06/13/2023 by Heddy Barren, DO.  Called Nurse Triage reporting No chief complaint on file..  Symptoms began today.  Interventions attempted: Nothing.  Symptoms are: stable.  Triage Disposition: Information or Advice Only Call  Patient/caregiver understands and will follow disposition?: Yes   Copied from CRM [id number]. Topic: Clinical - Medical Advice >> Aug 23, 2023  4:52 PM Mercer PEDLAR wrote: Reason for CRM: Patient stated that she took her BP and it is 115/81 and the devise said that it was high. She stated that she is not having symptoms but she is asking if she needs to take an aspirin. Reason for Disposition  Health information question, no triage required and triager able to answer question  Answer Assessment - Initial Assessment Questions Patient checked BP at work and reports reading of 115/81. States machine indicated high. She states that she has amlodipine  at home that she takes PRN when she suspects that her blood pressure is high. She reports last taking it 2 1/2 weeks ago. Per record, it was discontinued. Advised patient to not take it and that this RN would send a message to her provider's office. Advised to call if she develops symptoms such as headache or vision changes or suspects that her blood pressure is elevated.  1. REASON FOR CALL: What is the main reason for your call? or How can I best help you?     Asking about BP 2. SYMPTOMS : Do you have any symptoms?      No 3. OTHER QUESTIONS: Do you have any other questions?     No  Protocols used: Information Only Call - No Triage-A-AH

## 2023-08-26 ENCOUNTER — Other Ambulatory Visit: Payer: Self-pay

## 2023-08-26 ENCOUNTER — Ambulatory Visit

## 2023-08-26 ENCOUNTER — Other Ambulatory Visit: Payer: Self-pay | Admitting: Internal Medicine

## 2023-08-26 VITALS — BP 132/89 | HR 62 | Temp 98.3°F | Ht 62.0 in | Wt 130.0 lb

## 2023-08-26 DIAGNOSIS — L989 Disorder of the skin and subcutaneous tissue, unspecified: Secondary | ICD-10-CM

## 2023-08-26 DIAGNOSIS — L219 Seborrheic dermatitis, unspecified: Secondary | ICD-10-CM

## 2023-08-26 DIAGNOSIS — L309 Dermatitis, unspecified: Secondary | ICD-10-CM

## 2023-08-26 DIAGNOSIS — Z720 Tobacco use: Secondary | ICD-10-CM

## 2023-08-26 DIAGNOSIS — F17210 Nicotine dependence, cigarettes, uncomplicated: Secondary | ICD-10-CM

## 2023-08-26 MED ORDER — KETOCONAZOLE 1 % EX SHAM: MEDICATED_SHAMPOO | CUTANEOUS | 2 refills | Status: DC

## 2023-08-26 MED ORDER — HYDROCORTISONE 2.5 % EX CREA: TOPICAL_CREAM | Freq: Two times a day (BID) | CUTANEOUS | 0 refills | Status: DC

## 2023-08-26 MED ORDER — NICOTINE POLACRILEX 4 MG MT GUM: 4.0000 mg | CHEWING_GUM | OROMUCOSAL | 0 refills | Status: AC | PRN

## 2023-08-26 NOTE — Assessment & Plan Note (Addendum)
 Discussed with patient today that she has cut back to a little over half a pack, down from 1 PPD on her own. She did not pick up the Nicoderm patch, and would like to try the Nicorette  gum first. We discussed proper use of Nicorette  gum, and that it may not be as effective without a long-acting product, but she would like to try the gum first. We will use the Nicorette  4 mg since she is not on a long-acting right now. She had a prior adverse reaction to Chantix  including suicidal thoughts. Will have her return in 1 month for recheck and to see how she is progressing and consider adding the patch at that time. At her next visit, we will confirm her pack year history to determine if LDCT lung screening is warranted.  - Nicorette  gum 4 mg prn - At next visit, please calculate pack year history and consider LDCT lung cancer screen

## 2023-08-26 NOTE — Assessment & Plan Note (Addendum)
 Generalized skin lesions that are scabbed and in various healing stages, with mild itching and consistency similar to atopic dermatitis. No burrowing or lesions noted on webs of her fingers. We will try hydrocortisone  cream for this as she says that the triamcinolone  ointment did not improve them in May, but it had previously. Discussed that she can obtain a lesser strength OTC if insurance is unwilling to cover. She also notes a left cheek mole which she has had her entire life. She states that she has noticed in recent months that it has grown in size and feels different from how it did previously (see pic in media tab). She denies a personal history or family history of skin cancer. We will place a referral to Dermatology for her skin concerns.   - hydrocortisone  cream sent - Dermatology referral

## 2023-08-26 NOTE — Progress Notes (Signed)
 Acute Office Visit  Subjective:     Patient ID: [name], female    DOB: [birth year], 53 y.o.   MRN: [id number]  Chief Complaint  Patient presents with   Skin Problem    bumps on face, back, and arms     screening for cancer    Pt requesting throat/stomach cancer screening (knew someone that had it) Pt smokes a little over 1/2 ppd   [name] is a 53 yr old female with a history of Hep C, anxiety, depression and current tobacco use who desires to quit, who presents to clinic today for an acute visit regarding dermatology concerns.   She was seen in May 2025 in the [facility] for a pustule on her scalp. She was provided an rx for Ketoconazole  shampoo at that time. She states that she did not pick this prescription up because the pharmacy said that they did not have it. She then presented 4 days later to the ED due to pain from the bump spreading and was given Doxycycline  at that time which she reports she completed and has had no issues of that pustule returning.   Today she presents with 3 dermatologic concerns including an enlarging mole on her left cheek, scattered areas of itchy lesions that she has had off and on for many years, and also an itchy scalp. Patient states that the mole on her left cheek has been enlarging slowly for months-years now, and feels slightly different from how it did previously. She does not have a personal or family history of skin cancer. She also notes scattered areas on her wrists, inner elbows and back where she has scratched and have healing scabs. She states that these have come and gone for many years. She denies anyone else at home including her 3 grandchildren having these same lesions.   In terms of her smoking cessation, she reports that she has cut back to a little over 0.5 PPD on her own and is still interested in quitting. She states that she did not pick up the prescription for the nicotine  patch.   She endorses stressors at home including her 72 year  old grandson and her work client, but otherwise her mood is stable.    ROS: Denies headaches, dizziness, fever, chills, runny nose, sore throat, vision changes, hearing changes, chest pain, shortness of breath, difficulty breathing, nausea, vomiting, abdominal pain. Denies increased urinary frequency, pain with urination, constipation or diarrhea. No recent falls.       Objective:    BP 132/89 (BP Location: Right Arm, Patient Position: Sitting, Cuff Size: Normal)   Pulse 62   Temp 98.3 F (36.8 C) (Oral)   Ht 5' 2 (1.575 m)   Wt 130 lb (59 kg)   LMP  (LMP Unknown)   SpO2 96%   BMI 23.78 kg/m  BP Readings from Last 3 Encounters:  08/26/23 132/89  06/17/23 (!) 132/95  06/13/23 138/89   Wt Readings from Last 3 Encounters:  08/26/23 130 lb (59 kg)  06/17/23 125 lb (56.7 kg)  06/13/23 129 lb 8 oz (58.7 kg)      Physical Exam:   Constitutional: well-appearing female sitting in exam chair, in no acute distress. Ambulates without use of assistance device  HEENT: normocephalic atraumatic, mucous membranes moist Eyes: conjunctiva non-erythematous Neck: Scattered small scabs on posterior neck Cardiovascular: regular rate and rhythm, brisk capillary refill bilateral hands  Pulmonary/Chest: normal work of breathing on room air MSK: normal bulk and tone. Neurological:  alert & oriented x 3 Skin: warm and dry, scattered lesions of scabs over extensor wrist, flexor elbow and posterior neck region, seemingly most consistent with atopic dermatitis; mild scalp flaking, left cheek mole     Psych: mood calm, behavior normal, thought content normal, judgement normal    No results found for any visits on 08/26/23.      Assessment & Plan:   Problem List Items Addressed This Visit       Musculoskeletal and Integument   Skin lesions, generalized   Generalized skin lesions that are scabbed and in various healing stages, with mild itching and consistency similar to atopic dermatitis.  No burrowing or lesions noted on webs of her fingers. We will try hydrocortisone  cream for this as she says that the triamcinolone  ointment did not improve them in May, but it had previously. Discussed that she can obtain a lesser strength OTC if insurance is unwilling to cover. She also notes a left cheek mole which she has had her entire life. She states that she has noticed in recent months that it has grown in size and feels different from how it did previously (see pic in media tab). She denies a personal history or family history of skin cancer. We will place a referral to Dermatology for her skin concerns.   - hydrocortisone  cream sent - Dermatology referral       Seborrheic dermatitis - Primary   Patient was previously unable to obtain the ketoconazole  shampoo from the pharmacy for seborrheic dermatitis. She still endorses itchy scalp, though only mild flaking on physical exam. We will try to resend the prescription to see if insurance will cover, but advised her that a lesser strength of it can be obtained OTC.  - Ketoconazole  shampoo rx sent  - Dermatology referral       Relevant Medications   KETOCONAZOLE , TOPICAL, 1 % SHAM   Other Relevant Orders   Ambulatory referral to Dermatology     Other   Tobacco abuse (Chronic)   Discussed with patient today that she has cut back to a little over half a pack, down from 1 PPD on her own. She did not pick up the Nicoderm patch, and would like to try the Nicorette  gum first. We discussed proper use of Nicorette  gum, and that it may not be as effective without a long-acting product, but she would like to try the gum first. We will use the Nicorette  4 mg since she is not on a long-acting right now. She had a prior adverse reaction to Chantix  including suicidal thoughts. Will have her return in 1 month for recheck and to see how she is progressing and consider adding the patch at that time. At her next visit, we will confirm her pack year history to  determine if LDCT lung screening is warranted.  - Nicorette  gum 4 mg prn - At next visit, please calculate pack year history and consider LDCT lung cancer screen      Other Visit Diagnoses       Dermatitis       Relevant Medications   hydrocortisone  2.5 % cream   Other Relevant Orders   Ambulatory referral to Dermatology       Meds ordered this encounter  Medications   KETOCONAZOLE , TOPICAL, 1 % SHAM    Sig: Wash your scalp 2-3 times a week    Dispense:  125 mL    Refill:  2   hydrocortisone  2.5 % cream  Sig: Apply topically 2 (two) times daily.    Dispense:  30 g    Refill:  0   nicotine  polacrilex (NICORETTE ) 4 MG gum    Sig: Take 1 each (4 mg total) by mouth as needed for smoking cessation.    Dispense:  100 tablet    Refill:  0    Return in about 1 month (around 09/26/2023) for tobacco cessation, skin lesions, .  Patient discussed with Dr. Karna, who also saw and evaluated the patient.  Doyal Miyamoto, MD [facility]  PGY-1  08/26/23, 5:22 PM

## 2023-08-26 NOTE — Telephone Encounter (Signed)
 F/U - called pt - no answer; left message on vm to call the office if further assistance is needed.

## 2023-08-26 NOTE — Patient Instructions (Addendum)
 Thank you, Ms.Destinee Meldrum for allowing us  to provide your care today. Today we discussed the following:   - Please try the Ketoconazole  shampoo for your scalp. Try not to pick at the spots!  - If insurance does not approve the shampoo or hydrocortisone  cream, you can get them both OTC at a reduced strength  - I have sent in the Nicorette  gum to your pharmacy   Referrals ordered today:   Referral Orders         Ambulatory referral to Dermatology       I have ordered the following medication/changed the following medications:   Stop the following medications: Medications Discontinued During This Encounter  Medication Reason   KETOCONAZOLE , TOPICAL, 1 % SHAM    ketoconazole  (NIZORAL ) 2 % shampoo    nicotine  (NICODERM CQ  - DOSED IN MG/24 HOURS) 21 mg/24hr patch      Start the following medications: Meds ordered this encounter  Medications   KETOCONAZOLE , TOPICAL, 1 % SHAM    Sig: Wash your scalp 2-3 times a week    Dispense:  125 mL    Refill:  2   hydrocortisone  2.5 % cream    Sig: Apply topically 2 (two) times daily.    Dispense:  30 g    Refill:  0   nicotine  polacrilex (NICORETTE ) 4 MG gum    Sig: Take 1 each (4 mg total) by mouth as needed for smoking cessation.    Dispense:  100 tablet    Refill:  0     Follow up: in about 1-2 months for recheck    Remember: Please try not to pick at the itchy spots.   Should you have any questions or concerns please call the Internal Medicine Clinic at [phone].     Doyal Miyamoto, MD [facility]

## 2023-08-26 NOTE — Assessment & Plan Note (Signed)
 Patient was previously unable to obtain the ketoconazole  shampoo from the pharmacy for seborrheic dermatitis. She still endorses itchy scalp, though only mild flaking on physical exam. We will try to resend the prescription to see if insurance will cover, but advised her that a lesser strength of it can be obtained OTC.  - Ketoconazole  shampoo rx sent  - Dermatology referral

## 2023-08-28 NOTE — Progress Notes (Signed)
 Internal Medicine Clinic Attending  I was physically present during the key portions of the resident provided service and participated in the medical decision making of patient's management care. I reviewed pertinent patient test results.  The assessment, diagnosis, and plan were formulated together and I agree with the documentation in the resident's note.  Dickie La, MD

## 2023-09-17 ENCOUNTER — Ambulatory Visit
Admission: EM | Admit: 2023-09-17 | Discharge: 2023-09-17 | Disposition: A | Discharge status: Home / Self Care | Attending: Family Medicine | Admitting: Family Medicine

## 2023-09-17 DIAGNOSIS — L259 Unspecified contact dermatitis, unspecified cause: Secondary | ICD-10-CM | POA: Diagnosis not present

## 2023-09-17 NOTE — ED Provider Notes (Signed)
 [facility]    CSN: [id number] Arrival date & time: 09/17/23  1741      History   Chief Complaint Chief Complaint  Patient presents with   Rash    HPI [name] is a 53 y.o. female with a past medical history as seborrheic dermatitis, COPD, hypertension presents for rash.  Patient reports 4 days of a pruritic rash on the back of her neck.  She did start a new shampoo for her seborrheic dermatitis 3 weeks ago but is not sure if this is contributing.  States rash is itchy but denies pain, drainage, swelling, fevers or chills.  No history of eczema or psoriasis.  She has been using A&E ointment to the area.  No other concerns at this time.   Rash   Past Medical History:  Diagnosis Date   Allergy    Anxiety and depression    COPD (chronic obstructive pulmonary disease) (HCC)    History of hepatitis C    Hypertension     Patient Active Problem List   Diagnosis Date Noted   Seborrheic dermatitis 06/14/2023   Pre-employment health screening examination 06/07/2023   Elevated blood pressure reading 03/15/2023   Dyspepsia 03/15/2023   Breast pain, left 09/17/2022   Migraine 09/17/2022   History of abnormal cervical Pap smear 09/17/2022   Routine screening for STI (sexually transmitted infection) 05/12/2021   Encounter for health-related screening 05/12/2021   Fatigue 10/26/2020   Skin lesions, generalized 08/21/2019   Tobacco abuse 03/27/2016   Anxiety and depression 03/18/2016   History of hepatitis C     Past Surgical History:  Procedure Laterality Date   AUGMENTATION MAMMAPLASTY Bilateral    BREAST ENHANCEMENT SURGERY     CESAREAN SECTION     MYRINGOTOMY      OB History     Gravida  1   Para      Term      Preterm      AB      Living         SAB      IAB      Ectopic      Multiple      Live Births               Home Medications    Prior to Admission medications   Medication Sig Start Date End Date Taking? Authorizing  Provider  doxycycline  (VIBRAMYCIN ) 100 MG capsule Take 1 capsule (100 mg total) by mouth 2 (two) times daily. 06/17/23   Geiple, Joshua, PA-C  fluticasone  (FLONASE ) 50 MCG/ACT nasal spray Place 2 sprays into both nostrils daily. 06/06/23   Arellano Zameza, Priscila, MD  hydrocortisone  2.5 % cream APPLY TOPICALLY TWICE A DAY 08/27/23   Nooruddin, Saad, MD  ibuprofen  (ADVIL ) 800 MG tablet Take 800 mg by mouth every 8 (eight) hours as needed. 11/14/22   [provider]  ketoconazole  (NIZORAL ) 2 % shampoo WASH YOUR SCALP 2-3 TIMES A WEEK 08/27/23   Nooruddin, Roetta, MD  nicotine  polacrilex (NICORETTE ) 4 MG gum Take 1 each (4 mg total) by mouth as needed for smoking cessation. 08/26/23   Leontine Lapine, MD  OVER THE COUNTER MEDICATION Take 1 packet by mouth daily as needed.    [provider]  oxyCODONE  (OXY IR/ROXICODONE ) 5 MG immediate release tablet Take 1 tablet (5 mg total) by mouth every 6 (six) hours as needed for severe pain (pain score 7-10). 06/17/23   Desiderio Chew, PA-C  triamcinolone  (KENALOG )  0.025 % ointment Apply 1 Application topically 2 (two) times daily. 06/13/23   Heddy Barren, DO    Family History Family History  Problem Relation Age of Onset   Cancer Other    Ovarian cancer Maternal Aunt        deceased    Social History Social History   Tobacco Use   Smoking status: Every Day    Current packs/day: 0.50    Average packs/day: 0.5 packs/day for 30.0 years (15.0 ttl pk-yrs)    Types: Cigarettes   Smokeless tobacco: Never   Tobacco comments:    0.5 PPD  Vaping Use   Vaping status: Former  Substance Use Topics   Alcohol use: No   Drug use: Not Currently    Types: Marijuana     Allergies   Tylenol  [acetaminophen ] and Chantix  [varenicline ]   Review of Systems Review of Systems  Skin:  Positive for rash.     Physical Exam Triage Vital Signs ED Triage Vitals  Encounter Vitals Group     BP 09/17/23 1758 (!) 136/91     Girls Systolic BP  Percentile --      Girls Diastolic BP Percentile --      Boys Systolic BP Percentile --      Boys Diastolic BP Percentile --      Pulse Rate 09/17/23 1758 (!) 55     Resp 09/17/23 1758 16     Temp 09/17/23 1758 97.9 F (36.6 C)     Temp Source 09/17/23 1758 Oral     SpO2 09/17/23 1758 97 %     Weight --      Height --      Head Circumference --      Peak Flow --      Pain Score 09/17/23 1757 0     Pain Loc --      Pain Education --      Exclude from Growth Chart --    No data found.  Updated Vital Signs BP (!) 136/91 (BP Location: Right Arm)   Pulse (!) 55   Temp 97.9 F (36.6 C) (Oral)   Resp 16   LMP  (LMP Unknown)   SpO2 97%   Visual Acuity Right Eye Distance:   Left Eye Distance:   Bilateral Distance:    Right Eye Near:   Left Eye Near:    Bilateral Near:     Physical Exam Vitals and nursing note reviewed.  Constitutional:      General: She is not in acute distress.    Appearance: Normal appearance. She is not ill-appearing.  HENT:     Head: Normocephalic and atraumatic.  Eyes:     Pupils: Pupils are equal, round, and reactive to light.  Cardiovascular:     Rate and Rhythm: Normal rate.  Pulmonary:     Effort: Pulmonary effort is normal.  Skin:    General: Skin is warm and dry.         Comments: There is a mildly erythematous macular papular rash on the back of the neck.  There is no vesicles, lesions, swelling.  Seborrheic dermatitis noted in the scalp  Neurological:     General: No focal deficit present.     Mental Status: She is alert and oriented to person, place, and time.  Psychiatric:        Mood and Affect: Mood normal.        Behavior: Behavior normal.      UC Treatments /  Results  Labs (all labs ordered are listed, but only abnormal results are displayed) Labs Reviewed - No data to display  EKG   Radiology No results found.  Procedures Procedures (including critical care time)  Medications Ordered in UC Medications - No  data to display  Initial Impression / Assessment and Plan / UC Course  I have reviewed the triage vital signs and the nursing notes.  Pertinent labs & imaging results that were available during my care of the patient were reviewed by me and considered in my medical decision making (see chart for details).     Reviewed exam and symptoms with patient.  No red flags.  Discussed contact dermatitis.  She has a prescription for 2.5% hydrocortisone  cream at home that she can start using.  Also advise she start over-the-counter allergy medicine such as Claritin  or Zyrtec daily.  Follow-up with PCP if symptoms do not improve.  ER precautions reviewed. Final Clinical Impressions(s) / UC Diagnoses   Final diagnoses:  Contact dermatitis, unspecified contact dermatitis type, unspecified trigger     Discharge Instructions      Start over the Claritin  or Zyrtec daily for at least 7 days.  You can use your prescription check for records or cream provided by your primary care provider twice daily to the area for itching.  Please follow-up with your PCP if your symptoms do not improve.  Please go to the ER for any worsening symptoms.  Hope you feel better soon!    ED Prescriptions   None    PDMP not reviewed this encounter.   Loreda Myla SAUNDERS, NP 09/17/23 JEROLYN

## 2023-09-17 NOTE — Discharge Instructions (Signed)
 Start over the Claritin  or Zyrtec daily for at least 7 days.  You can use your prescription check for records or cream provided by your primary care provider twice daily to the area for itching.  Please follow-up with your PCP if your symptoms do not improve.  Please go to the ER for any worsening symptoms.  Hope you feel better soon!

## 2023-09-17 NOTE — ED Triage Notes (Signed)
 Pt present with a rash on the neck x four days. Pt states it has not gotten better. C/o itching and burning.  Pt has applied A&D ointment.

## 2023-10-10 ENCOUNTER — Ambulatory Visit: Payer: Self-pay

## 2023-10-10 NOTE — Telephone Encounter (Signed)
 RTC to patient states has some Shortness of breath .  Has it as well when she yawns.  Currently at work.  States got an Inhaler before and that helped quite a bit.  Does not know the name of.  Only that it was round.  Unable to go to Urgent Care until after 6 PM tonight. No fevers currently.

## 2023-10-10 NOTE — Telephone Encounter (Signed)
 FYI Only or Action Required?: Action required by provider: Advised UC/ED. No available appts today in clinic.  Patient was last seen in primary care on 08/26/2023 by Leontine Lapine, MD.  Called Nurse Triage reporting Breathing Problem.  Symptoms began several days ago.  Interventions attempted: Nothing.  Symptoms are: unchanged.  Triage Disposition: See PCP When Office is Open (Within 3 Days)  Patient/caregiver understands and will follow disposition?: Yes      Copied from CRM #[id number]. Topic: Clinical - Red Word Triage >> Oct 10, 2023  2:42 PM Mercer PEDLAR wrote: Red Word that prompted transfer to Nurse Triage: Patient is having a hard time taking in a deep breath, calling to request an inhaler. Reason for Disposition  [1] MODERATE longstanding difficulty breathing (e.g., speaks in phrases, SOB even at rest, pulse 100-120) AND [2] SAME as normal  Answer Assessment - Initial Assessment Questions No available appts in clinic today. Advised UC/ED. Patient reports will go to UC later.  Patient reports shortness of breath when yarning or taking deep breaths. Denies fever, chest pain, dizziness/faint, HA.  Reports nasal and chest congestion, dry cough. Patient reports had breathing difficulty before and prescribed inhaler; requesting new prescription.  Reports have not tried anything  1. RESPIRATORY STATUS: Describe your breathing? (e.g., wheezing, shortness of breath, unable to speak, severe coughing)      Shortness of breath, can't take a deep breath, slight wheezing 2. ONSET: When did this breathing problem begin?      Few days ago; can't finish yearn or deep breath completeley 3. PATTERN Does the difficult breathing come and go, or has it been constant since it started?      Come and goes with deep breathing or yarning 4. SEVERITY: How bad is your breathing? (e.g., mild, moderate, severe)      mild 5. RECURRENT SYMPTOM: Have you had difficulty breathing before? If Yes,  ask: When was the last time? and What happened that time?      Had appt with pcp and was given an inhaler 6. CARDIAC HISTORY: Do you have any history of heart disease? (e.g., heart attack, angina, bypass surgery, angioplasty)      no 7. LUNG HISTORY: Do you have any history of lung disease?  (e.g., pulmonary embolus, asthma, emphysema)     no 8. CAUSE: What do you think is causing the breathing problem?      Not sure 9. OTHER SYMPTOMS: Do you have any other symptoms? (e.g., chest pain, cough, dizziness, fever, runny nose)     Denies pain, chest pain, runny nose; nasla and chest congetsoin, dizziness,  Current Tobacco smoker  12. TRAVEL: Have you traveled out of the country in the last month? (e.g., travel history, exposures)       no  Protocols used: Breathing Difficulty-A-AH

## 2023-10-11 ENCOUNTER — Ambulatory Visit
Admission: EM | Admit: 2023-10-11 | Discharge: 2023-10-11 | Disposition: A | Discharge status: Home / Self Care | Attending: Family Medicine | Admitting: Family Medicine

## 2023-10-11 ENCOUNTER — Ambulatory Visit (INDEPENDENT_AMBULATORY_CARE_PROVIDER_SITE_OTHER)

## 2023-10-11 DIAGNOSIS — J209 Acute bronchitis, unspecified: Secondary | ICD-10-CM | POA: Diagnosis not present

## 2023-10-11 DIAGNOSIS — R0602 Shortness of breath: Secondary | ICD-10-CM | POA: Diagnosis not present

## 2023-10-11 DIAGNOSIS — F17200 Nicotine dependence, unspecified, uncomplicated: Secondary | ICD-10-CM | POA: Diagnosis not present

## 2023-10-11 DIAGNOSIS — R52 Pain, unspecified: Secondary | ICD-10-CM

## 2023-10-11 LAB — POC COVID19/FLU A&B COMBO
Covid Antigen, POC: NEGATIVE
Influenza A Antigen, POC: NEGATIVE
Influenza B Antigen, POC: NEGATIVE

## 2023-10-11 MED ORDER — ALBUTEROL SULFATE HFA 108 (90 BASE) MCG/ACT IN AERS: 1.0000 | INHALATION_SPRAY | Freq: Four times a day (QID) | RESPIRATORY_TRACT | 0 refills | Status: AC | PRN

## 2023-10-11 MED ORDER — PREDNISONE 20 MG PO TABS
ORAL_TABLET | ORAL | 0 refills | Status: DC
Start: 2023-10-11 — End: 2023-10-16

## 2023-10-11 MED ORDER — AZITHROMYCIN 250 MG PO TABS
ORAL_TABLET | ORAL | 0 refills | Status: DC
Start: 2023-10-11 — End: 2023-10-16

## 2023-10-11 NOTE — ED Triage Notes (Signed)
 Pt c/o SHOB started yesterday-diarrhea and body aches started today-no meds PTA-NAD-steady gait

## 2023-10-11 NOTE — ED Provider Notes (Signed)
 [facility]  Note:  This document was prepared using Conservation officer, historic buildings and may include unintentional dictation errors.  MRN: [id number] DOB: 11-Jan-1971  Subjective:   [name] is a 53 y.o. female presenting for 1 day history of acute onset shortness of breath, runny and stuffy nose, malaise, fatigue, body pains, diarrhea.  Has longstanding history of smoking.  Her regular doctor suggested she come in for a chest x-ray.  Patient would also like to be tested for COVID and flu.  Smokes 1/2ppd.   No current facility-administered medications for this encounter.  Current Outpatient Medications:    doxycycline  (VIBRAMYCIN ) 100 MG capsule, Take 1 capsule (100 mg total) by mouth 2 (two) times daily., Disp: 14 capsule, Rfl: 0   fluticasone  (FLONASE ) 50 MCG/ACT nasal spray, Place 2 sprays into both nostrils daily., Disp: 16 g, Rfl: 0   hydrocortisone  2.5 % cream, APPLY TOPICALLY TWICE A DAY, Disp: 28 g, Rfl: 0   ibuprofen  (ADVIL ) 800 MG tablet, Take 800 mg by mouth every 8 (eight) hours as needed., Disp: , Rfl:    ketoconazole  (NIZORAL ) 2 % shampoo, WASH YOUR SCALP 2-3 TIMES A WEEK, Disp: 120 mL, Rfl: 2   nicotine  polacrilex (NICORETTE ) 4 MG gum, Take 1 each (4 mg total) by mouth as needed for smoking cessation., Disp: 100 tablet, Rfl: 0   OVER THE COUNTER MEDICATION, Take 1 packet by mouth daily as needed., Disp: , Rfl:    oxyCODONE  (OXY IR/ROXICODONE ) 5 MG immediate release tablet, Take 1 tablet (5 mg total) by mouth every 6 (six) hours as needed for severe pain (pain score 7-10)., Disp: 8 tablet, Rfl: 0   triamcinolone  (KENALOG ) 0.025 % ointment, Apply 1 Application topically 2 (two) times daily., Disp: 30 g, Rfl: 0   Allergies  Allergen Reactions   Tylenol  [Acetaminophen ] Other (See Comments)    hepatitis   Chantix  [Varenicline ]     Suicidal ideations     Past Medical History:  Diagnosis Date   Allergy    Anxiety and depression    COPD (chronic  obstructive pulmonary disease) (HCC)    History of hepatitis C    Hypertension      Past Surgical History:  Procedure Laterality Date   AUGMENTATION MAMMAPLASTY Bilateral    BREAST ENHANCEMENT SURGERY     CESAREAN SECTION     MYRINGOTOMY      Family History  Problem Relation Age of Onset   Cancer Other    Ovarian cancer Maternal Aunt        deceased    Social History   Tobacco Use   Smoking status: Every Day    Current packs/day: 0.50    Average packs/day: 0.5 packs/day for 30.0 years (15.0 ttl pk-yrs)    Types: Cigarettes   Smokeless tobacco: Never   Tobacco comments:    0.5 PPD  Vaping Use   Vaping status: Never Used  Substance Use Topics   Alcohol use: No   Drug use: Not Currently    Types: Marijuana    ROS   Objective:   Vitals: BP 108/78 (BP Location: Left Arm)   Pulse 80   Temp 98.2 F (36.8 C) (Oral)   Resp 20   LMP  (LMP Unknown)   SpO2 94%   Physical Exam Constitutional:      General: She is not in acute distress.    Appearance: Normal appearance. She is well-developed and normal weight. She is ill-appearing. She is not toxic-appearing  or diaphoretic.  HENT:     Head: Normocephalic and atraumatic.     Right Ear: Tympanic membrane, ear canal and external ear normal. No drainage or tenderness. No middle ear effusion. There is no impacted cerumen. Tympanic membrane is not erythematous or bulging.     Left Ear: Tympanic membrane, ear canal and external ear normal. No drainage or tenderness.  No middle ear effusion. There is no impacted cerumen. Tympanic membrane is not erythematous or bulging.     Nose: Nose normal. No congestion or rhinorrhea.     Mouth/Throat:     Mouth: Mucous membranes are moist. No oral lesions.     Pharynx: No pharyngeal swelling, oropharyngeal exudate, posterior oropharyngeal erythema or uvula swelling.     Tonsils: No tonsillar exudate or tonsillar abscesses.  Eyes:     General: No scleral icterus.       Right eye: No  discharge.        Left eye: No discharge.     Extraocular Movements: Extraocular movements intact.     Right eye: Normal extraocular motion.     Left eye: Normal extraocular motion.     Conjunctiva/sclera: Conjunctivae normal.  Cardiovascular:     Rate and Rhythm: Normal rate and regular rhythm.     Heart sounds: Normal heart sounds. No murmur heard.    No friction rub. No gallop.  Pulmonary:     Effort: Pulmonary effort is normal. No respiratory distress.     Breath sounds: No stridor. Decreased breath sounds present. No wheezing, rhonchi or rales.  Chest:     Chest wall: No tenderness.  Musculoskeletal:     Cervical back: Normal range of motion and neck supple.  Lymphadenopathy:     Cervical: No cervical adenopathy.  Skin:    General: Skin is warm and dry.  Neurological:     General: No focal deficit present.     Mental Status: She is alert and oriented to person, place, and time.  Psychiatric:        Mood and Affect: Mood normal.        Behavior: Behavior normal.     Negative COVID, flu testing.  DG Chest 2 View Result Date: 10/11/2023 EXAM: 2 VIEW(S) XRAY OF THE CHEST 10/11/2023 02:28:38 PM COMPARISON: 08/10/2013. The interstitial coarsening has slightly progressed in the interval. CLINICAL HISTORY: Shortness of breath. FINDINGS: LUNGS AND PLEURA: Coarse bilateral interstitial opacities. No focal pulmonary opacity. No pulmonary edema. No pleural effusion. No pneumothorax. HEART AND MEDIASTINUM: No acute abnormality of the cardiac and mediastinal silhouettes. BONES AND SOFT TISSUES: No acute osseous abnormality. IMPRESSION: 1. Coarse bilateral interstitial opacities, slightly progressed compared to 08/10/13. Electronically signed by: Lonni Necessary MD 10/11/2023 02:39 PM EDT RP Workstation: [id number]     Assessment and Plan :   PDMP not reviewed this encounter.  1. Acute bronchitis, unspecified organism   2. Body aches   3. Shortness of breath   4. Smoker      Will manage for bronchitis with azithromycin , prednisone .  Recommend supportive care otherwise.  Refilled her albuterol .  Counseled patient on potential for adverse effects with medications prescribed/recommended today, ER and return-to-clinic precautions discussed, patient verbalized understanding.    Christopher Savannah, [place] 10/11/23 [id number]

## 2023-10-11 NOTE — Telephone Encounter (Signed)
 Call to patient-message left to see if she had gone to an Urgent Care.  Message was left that the Clinics had called..  If she continues with the same symptoms she should go to an Urgent Care.

## 2023-10-15 ENCOUNTER — Ambulatory Visit: Payer: Self-pay

## 2023-10-15 ENCOUNTER — Telehealth: Payer: Self-pay | Admitting: *Deleted

## 2023-10-15 NOTE — Telephone Encounter (Signed)
 Forwarding this pt's message to the Triage Nurse Kenneth.  Copied from CRM [id number]. Topic: General - Other >> Oct 11, 2023  1:35 PM Laurier C wrote: Reason for CRM: Patient called to speak with Kenneth. Patient states she is being seen at urgent care & was advised they will only take an XRAY at the providers discretion. She wanted to make [facility] aware. Please follow up  with the patient at [phone]

## 2023-10-15 NOTE — Telephone Encounter (Signed)
 FYI Only or Action Required?: Action required by provider: request for appointment.  Patient was last seen in primary care on 08/26/2023 by Leontine Lapine, MD.  Called Nurse Triage reporting No chief complaint on file..  Symptoms began several days ago.  Interventions attempted: Prescription medications: Prednisone , antibiotic.  Symptoms are: unchanged. Seen in UC , still has SOB. Warm transfer to [facility] in the practice for appointment.  Triage Disposition: See PCP When Office is Open (Within 3 Days)  Patient/caregiver understands and will follow disposition?: Yes    Copied from CRM #[id number]. Topic: Clinical - Red Word Triage >> Oct 15, 2023 11:19 AM Zane F wrote: [facility] that prompted transfer to Nurse Triage:  Finished antibiotic; patient stated she feels like she is not feeling any better; patient is feeling like she still has a thick mucous present in her lungs; patient has reduced cigarettes down to five a day versus a whole pack Reason for Disposition  [1] MODERATE longstanding difficulty breathing (e.g., speaks in phrases, SOB even at rest, pulse 100-120) AND [2] SAME as normal  Answer Assessment - Initial Assessment Questions 1. RESPIRATORY STATUS: Describe your breathing? (e.g., wheezing, shortness of breath, unable to speak, severe coughing)      SOB 2. ONSET: When did this breathing problem begin?      Last week 3. PATTERN Does the difficult breathing come and go, or has it been constant since it started?      Comes and goes 4. SEVERITY: How bad is your breathing? (e.g., mild, moderate, severe)      moderate 5. RECURRENT SYMPTOM: Have you had difficulty breathing before? If Yes, ask: When was the last time? and What happened that time?      yes 6. CARDIAC HISTORY: Do you have any history of heart disease? (e.g., heart attack, angina, bypass surgery, angioplasty)      no 7. LUNG HISTORY: Do you have any history of lung disease?  (e.g., pulmonary  embolus, asthma, emphysema)     no 8. CAUSE: What do you think is causing the breathing problem?      infection 9. OTHER SYMPTOMS: Do you have any other symptoms? (e.g., chest pain, cough, dizziness, fever, runny nose)     cugh 10. O2 SATURATION MONITOR:  Do you use an oxygen saturation monitor (pulse oximeter) at home? If Yes, ask: What is your reading (oxygen level) today? What is your usual oxygen saturation reading? (e.g., 95%)       no 11. PREGNANCY: Is there any chance you are pregnant? When was your last menstrual period?       no 12. TRAVEL: Have you traveled out of the country in the last month? (e.g., travel history, exposures)       no  Protocols used: Breathing Difficulty-A-AH

## 2023-10-15 NOTE — Telephone Encounter (Unsigned)
 Copied from CRM [id number]. Topic: Clinical - Lab/Test Results >> Oct 15, 2023 11:24 AM Zane F wrote: Reason for CRM:   Patient is calling in to get the recent results from imaging she had done a few days ago. Patient was informed they are available for review.   Callback Number: [id number]

## 2023-10-15 NOTE — Telephone Encounter (Signed)
 Appt given for 09/03 at 0815 with Dr Harrie, pt aware. No further action needed, phone call complete. Pt will seek more urgent medical care if symptoms warrant.

## 2023-10-16 ENCOUNTER — Encounter: Payer: Self-pay | Admitting: Student

## 2023-10-16 ENCOUNTER — Ambulatory Visit (INDEPENDENT_AMBULATORY_CARE_PROVIDER_SITE_OTHER): Admitting: Student

## 2023-10-16 VITALS — BP 127/89 | HR 57 | Temp 97.9°F | Ht 62.0 in | Wt 129.4 lb

## 2023-10-16 DIAGNOSIS — F17200 Nicotine dependence, unspecified, uncomplicated: Secondary | ICD-10-CM

## 2023-10-16 DIAGNOSIS — F32A Depression, unspecified: Secondary | ICD-10-CM | POA: Diagnosis not present

## 2023-10-16 DIAGNOSIS — F419 Anxiety disorder, unspecified: Secondary | ICD-10-CM

## 2023-10-16 DIAGNOSIS — J441 Chronic obstructive pulmonary disease with (acute) exacerbation: Secondary | ICD-10-CM

## 2023-10-16 DIAGNOSIS — J449 Chronic obstructive pulmonary disease, unspecified: Secondary | ICD-10-CM

## 2023-10-16 DIAGNOSIS — F17210 Nicotine dependence, cigarettes, uncomplicated: Secondary | ICD-10-CM

## 2023-10-16 DIAGNOSIS — Z720 Tobacco use: Secondary | ICD-10-CM

## 2023-10-16 MED ORDER — AZITHROMYCIN 250 MG PO TABS: ORAL_TABLET | ORAL | 0 refills | Status: DC

## 2023-10-16 MED ORDER — VARENICLINE TARTRATE 1 MG PO TABS: ORAL_TABLET | ORAL | 0 refills | Status: AC

## 2023-10-16 MED ORDER — BUPROPION HCL ER (SR) 150 MG PO TB12: ORAL_TABLET | ORAL | 2 refills | Status: DC

## 2023-10-16 MED ORDER — PREDNISONE 20 MG PO TABS: ORAL_TABLET | ORAL | 0 refills | Status: DC

## 2023-10-16 NOTE — Assessment & Plan Note (Addendum)
 Mood symptoms stable. Prior challenges with substance abuse resolved. She has great social support with family, purpose in adopting and raising a new son. Not on medications right now (Wellbutrin  for smoking likely not needed indefinitely). Monitor going forward.

## 2023-10-16 NOTE — Patient Instructions (Signed)
 Start chantix  and wellbutrin  when ready. Stop smoking 1 week after starting these medicines.

## 2023-10-16 NOTE — Assessment & Plan Note (Signed)
 Approximate history of 35 pack years.  Started smoking at age 53, currently half a pack per day, at most 1.5 packs/day.  Smoking is a major social event for her family, they live together and smoke together.  But she is motivated to quit because she wants to be around for her family including a 66-year-old that she recently adopted.  Tried Nicorette  in the past, but without success.  Tried Chantix  and Wellbutrin  together in the past and successfully quit for 8 months.  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 - Will provide a 12-week course of Wellbutrin  and Chantix .  Advised that she starts in a couple weeks after this acute exacerbation has ended.  She will target a quit smoking date 1 week after starting these medicines.

## 2023-10-16 NOTE — Progress Notes (Signed)
 CC: Shortness of breath  HPI:  [name] is a 53 y.o. female with a PMH stated below who presents today for evaluation of shortness of breath after presenting to an urgent care last week and she is finishing treatment for COPD exacerbation was not fully recovered.  Please see problem based assessment and plan for additional details.  Past Medical History:  Diagnosis Date   Allergy    Anxiety and depression    COPD (chronic obstructive pulmonary disease) (HCC)    History of hepatitis C    Hypertension    Review of Systems: ROS negative except for what is noted on the assessment and plan.  Vitals:   10/16/23 0819  BP: 127/89  Pulse: (!) 57  Temp: 97.9 F (36.6 C)  TempSrc: Oral  SpO2: 100%  Weight: 129 lb 6.4 oz (58.7 kg)  Height: 5' 2 (1.575 m)    Physical Exam: Constitutional: well-appearing woman in no acute distress Cardiovascular: regular rate and rhythm, no m/r/g Pulmonary/Chest: normal work of breathing on room air, lungs clear to auscultation bilaterally without wheezes or consolidation Abdominal: soft, non-tender, non-distended MSK: normal bulk and tone Skin: warm and dry Psych: normal mood and behavior  Assessment & Plan:   Patient discussed with Dr. Trudy  COPD exacerbation [facility]) She is not formally diagnosed with COPD and does not regularly use inhalers.  However she is a smoker of over 35 pack years who presented to urgent care last Friday for acute dyspnea without hypoxia or respiratory distress and was provided treatment for COPD exacerbation with a 5-day course of azithromycin  and prednisone  and an albuterol  inhaler.  Trigger was dust in her household from a portable fan, no sick contacts, no fevers, nothing focal on lung examination which is CTABL.  Chest x-ray did not show pneumonia but did show interstitial thickening.  She has responded well to the treatment provided but is not fully resolved, continues with a wet cough, some clear sputum,  occasional deep breaths but no tachypnea or distress..  As such I will extend her course and refer for PFTs to attempt a formal diagnosis. - Extend prednisone  40 daily and azithromycin  250 daily for 5 more days to make a 10-day total of each - I will refer for PFTs - Address smoking per below  Tobacco abuse Approximate history of 35 pack years.  Started smoking at age 48, currently half a pack per day, at most 1.5 packs/day.  Smoking is a major social event for her family, they live together and smoke together.  But she is motivated to quit because she wants to be around for her family including a 53-year-old that she recently adopted.  Tried Nicorette  in the past, but without success.  Tried Chantix  and Wellbutrin  together in the past and successfully quit for 8 months.  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 - Will provide a 12-week course of Wellbutrin  and Chantix .  Advised that she starts in a couple weeks after this acute exacerbation has ended.  She will target a quit smoking date 1 week after starting these medicines.  Anxiety and depression Mood symptoms stable. Prior challenges with substance abuse resolved. She has great social support with family, purpose in adopting and raising a new son. Not on  medications right now (Wellbutrin  for smoking likely not needed indefinitely). Monitor going forward.  RTC in 3 months to follow-up on smoking cessation, PFTs and likely COPD.  Consider inhaler therapy and pulmonary referral.  Lonni Africa, D.O. [facility] Internal Medicine, PGY-2 Phone: [phone] Date 10/16/2023 Time 3:00 PM

## 2023-10-16 NOTE — Assessment & Plan Note (Addendum)
 She is not formally diagnosed with COPD and does not regularly use inhalers.  However she is a smoker of over 35 pack years who presented to urgent care last Friday for acute dyspnea without hypoxia or respiratory distress and was provided treatment for COPD exacerbation with a 5-day course of azithromycin  and prednisone  and an albuterol  inhaler.  Trigger was dust in her household from a portable fan, no sick contacts, no fevers, nothing focal on lung examination which is CTABL.  Chest x-ray did not show pneumonia but did show interstitial thickening.  She has responded well to the treatment provided but is not fully resolved, continues with a wet cough, some clear sputum, occasional deep breaths but no tachypnea or distress..  As such I will extend her course and refer for PFTs to attempt a formal diagnosis. - Extend prednisone  40 daily and azithromycin  250 daily for 5 more days to make a 10-day total of each - I will refer for PFTs - Address smoking per below

## 2023-10-16 NOTE — Telephone Encounter (Signed)
 Pt was seen today 9/3 by dr Harrie.

## 2023-10-18 NOTE — Progress Notes (Signed)
 Internal Medicine Clinic Attending  Case discussed with the resident at the time of the visit.  We reviewed the resident's history and exam and pertinent patient test results.  I agree with the assessment, diagnosis, and plan of care documented in the resident's note.

## 2023-10-23 ENCOUNTER — Ambulatory Visit: Payer: Self-pay

## 2023-10-23 NOTE — Telephone Encounter (Signed)
 Return call from pt - stated she went to UC,prescribed abx and steroids. Stated she had to stop taking the steroids b/c it interacted with Chantix  and Wellbutrin  - she was jittery, sweaty, felt like she was losing her mind. Stated it's hard for her to catch her breath. She's using the inhaler. Stated she only took 2 puffs of cigarette today.  Pt wants to know if the doctor could prescribe something else. No appts available at this time until Monday. I will send pt's concerns to the doctor.

## 2023-10-23 NOTE — Telephone Encounter (Signed)
 FYI Only or Action Required?: FYI only for provider.  Patient was last seen in primary care on 10/16/2023 by Harrie Bruckner, DO.  Called Nurse Triage reporting Shortness of Breath.  Symptoms began a week ago.  Interventions attempted: Prescription medications: Prednisone , Antibiotic.  Symptoms are: unchanged.  Triage Disposition: See PCP Within 2 Weeks  Patient/caregiver understands and will follow disposition?: Yes Reason for Disposition  [1] MILD longstanding difficulty breathing (e.g., minimal/no SOB at rest, SOB with walking, pulse < 100) AND [2] SAME as normal  Answer Assessment - Initial Assessment Questions Been to UC and [facility], on antibiotic and steroids. Sweats and irritability felt like eyes were going to pop out of her head, felt like I done an 8 ball of cocaine, couldn't sit still on Wellbutrin , Prednisone  and Chantix , stopped taking prednisone  due to interactions, does not think antibiotics is strong enough. States the inhaler is helping a little. Keeps stating she thinks she should have gotten a stronger antibiotic. Denies worsening, but not getting better. Patient would like to be sent [facility] [address]. Patient requesting call back [phone]   1. RESPIRATORY STATUS: Describe your breathing? (e.g., wheezing, shortness of breath, unable to speak, severe coughing)      Can't get a full yawn, or a full deep breath  2. ONSET: When did this breathing problem begin?      About a week ago  Protocols used: Breathing Difficulty-A-AH  Copied from CRM [id number]. Topic: Clinical - Red Word Triage >> Oct 23, 2023  4:07 PM Miquel SAILOR wrote: Red Word that prompted transfer to Nurse Triage: Can not catch her breath/Went to UC on 08/29 and [facility] on 09/03 May have bacteria in lungs

## 2023-10-24 ENCOUNTER — Ambulatory Visit (INDEPENDENT_AMBULATORY_CARE_PROVIDER_SITE_OTHER)

## 2023-10-24 ENCOUNTER — Ambulatory Visit: Payer: Self-pay

## 2023-10-24 ENCOUNTER — Ambulatory Visit: Payer: Self-pay | Admitting: *Deleted

## 2023-10-24 VITALS — BP 108/80 | HR 60 | Temp 97.8°F | Wt 132.0 lb

## 2023-10-24 DIAGNOSIS — B370 Candidal stomatitis: Secondary | ICD-10-CM

## 2023-10-24 DIAGNOSIS — Z7952 Long term (current) use of systemic steroids: Secondary | ICD-10-CM | POA: Diagnosis not present

## 2023-10-24 DIAGNOSIS — Z7951 Long term (current) use of inhaled steroids: Secondary | ICD-10-CM

## 2023-10-24 DIAGNOSIS — Z792 Long term (current) use of antibiotics: Secondary | ICD-10-CM

## 2023-10-24 DIAGNOSIS — B349 Viral infection, unspecified: Secondary | ICD-10-CM

## 2023-10-24 DIAGNOSIS — J069 Acute upper respiratory infection, unspecified: Secondary | ICD-10-CM | POA: Diagnosis present

## 2023-10-24 DIAGNOSIS — F17210 Nicotine dependence, cigarettes, uncomplicated: Secondary | ICD-10-CM | POA: Diagnosis not present

## 2023-10-24 MED ORDER — NYSTATIN 100000 UNIT/ML MT SUSP: 5.0000 mL | Freq: Four times a day (QID) | OROMUCOSAL | 0 refills | Status: AC

## 2023-10-24 NOTE — Telephone Encounter (Signed)
 Copied from CRM [id number]. Topic: Clinical - Red Word Triage >> Oct 24, 2023  8:29 AM Zane F wrote: Red Word that prompted transfer to Nurse Triage:   Throat is very sore and painful; back of tongue is white (Patient believes she may have thrush); can't take a deep breath Reason for Disposition  [1] White patches that stick to tongue or inner cheek AND [2] can be wiped off  Answer Assessment - Initial Assessment Questions 1. SYMPTOM: What's the main symptom you're concerned about? (e.g., chapped lips, dry mouth, lump, sores)     I have white stuff on my tongue and my throat is sore.   I've been using an inhaler and not rinsing my mouth out because I've been in a hurry.   I usually rinse my mouth but lately I have not been able to rinse.   I can't get the white stuff off. 2. ONSET: When did the  white stuff on tongue start?     Day before yesterday 3. PAIN: Is there any pain? If Yes, ask: How bad is it? (Scale: 0-10; or none, mild, moderate, severe)     Yes my throat is sore and my tongue is hurting. 4. CAUSE: What do you think is causing the symptoms?     Not rinsing after using my inhaler 5. OTHER SYMPTOMS: Do you have any other symptoms? (e.g., fever, sore throat, toothache, swelling)     No 6. PREGNANCY: Is there any chance you are pregnant? When was your last menstrual period?     Not asked  Protocols used: Mouth Symptoms-A-AH FYI Only or Action Required?: FYI only for provider.  Patient was last seen in primary care on 10/16/2023 by Harrie Bruckner, DO.  Called Nurse Triage reporting Mouth Lesions. Has white stuff on tongue and sore throat from not rinsing her mouth after using her inhaler for the past 2 days.   Symptoms began several days ago. Started day before yesterday  Interventions attempted: Nothing.  Symptoms are: gradually worsening.  Triage Disposition: See PCP When Office is Open (Within 3 Days)  Patient/caregiver understands and will follow  disposition?: Yes

## 2023-10-24 NOTE — Telephone Encounter (Signed)
  FYI Only or Action Required?: FYI only for provider.  Patient was last seen in primary care on 10/16/2023 by Harrie Bruckner, DO.  Called Nurse Triage reporting Sore Throat and Thrush.  Symptoms began yesterday.  Interventions attempted: Nothing.  Symptoms are: gradually worsening.  Triage Disposition: See Physician Within 24 Hours  Patient/caregiver understands and will follow disposition?: Yes Copied from CRM #[id number]. Topic: Clinical - Red Word Triage >> Oct 24, 2023 12:55 PM Alfonso ORN wrote: Red Word that prompted transfer to Nurse Triage:  have a really bad sore throat thinking may have thrust  pain rate 7 hurt when swallow , Reason for Disposition  SEVERE throat pain (e.g., excruciating)  Answer Assessment - Initial Assessment Questions 1. ONSET: When did the throat start hurting? (Hours or days ago)      Two days ago 2. SEVERITY: How bad is the sore throat? (Scale 1-10; mild, moderate or severe)     7/10 5. FEVER: Do you have a fever? If Yes, ask: What is your temperature, how was it measured, and when did it start?     unsure  7. OTHER SYMPTOMS: Do you have any other symptoms? (e.g., difficulty breathing, headache, rash)     Patient thinks that she has thrush due to inhaler use.  Using ventolin . Has not been rinsing her mouth 8. PREGNANCY: Is there any chance you are pregnant? When was your last menstrual period?     N/A  Patient has sick and has been on antibiotics and steroids  Protocols used: Sore Throat-A-AH

## 2023-10-24 NOTE — Telephone Encounter (Signed)
 Pt has an appt today @1530PM .

## 2023-10-24 NOTE — Patient Instructions (Addendum)
 Thank you, [name] for allowing us  to provide your care today. Today we discussed the new thrush you have noticed recently.    New medications: -Nystatin  Mouthwash, use up to four times a day for symptom relief and to resolve your thrush. IF you notice that your symptoms are not improving, please come back next week to be seen.      We look forward to seeing you next time. Please call our clinic at [phone] if you have any questions or concerns. The best time to call is Monday-Friday from 9am-4pm, but there is someone available 24/7. If after hours or the weekend, call the main hospital number and ask for the Internal Medicine Resident On-Call. If you need medication refills, please notify your pharmacy one week in advance and they will send us  a request.   Thank you for letting us  take part in your care. Wishing you the best!  Carman Essick, DO 10/24/2023, 4:11 PM [facility]

## 2023-10-24 NOTE — Progress Notes (Signed)
 nsy  Acute Office Visit  Subjective:     Patient ID: [name], female    DOB: 1971-02-02, 53 y.o.   MRN: [id number]  Chief Complaint  Patient presents with   Sore Throat   Follow-up    [name] is a 53 year old female with a past medical history of anxiety depression, COPD, history of hepatitis C, and hypertension who presents to the office today for new thrush that she has noticed developing on her tongue. She endorses Ventolin  inhaler use and not rinsing out her mouth after her inhaler for the last couple of days.  She has recently been sick and has been on antibiotics and steroids.  She also endorses a sore throat.  The symptoms all began Tuesday afternoon.  She has not tried anything to help resolve her symptoms, and states that her symptoms are gradually worsening.  States that her sore throat pain is a 7 out of 10.  Please see problem-based assessment and plan below for details.    Review of Systems  Constitutional:  Positive for malaise/fatigue. Negative for chills and fever.  HENT: Negative.    Eyes: Negative.   Respiratory:  Positive for cough.   Cardiovascular: Negative.   Gastrointestinal:  Positive for abdominal pain.  Genitourinary: Negative.   Musculoskeletal: Negative.   Skin: Negative.   Neurological: Negative.   Endo/Heme/Allergies: Negative.   Psychiatric/Behavioral: Negative.         Objective:    BP 108/80 (BP Location: Right Arm, Patient Position: Sitting, Cuff Size: Small)   Pulse 60   Temp 97.8 F (36.6 C) (Oral)   Wt 132 lb (59.9 kg)   LMP  (LMP Unknown)   SpO2 100%   BMI 24.14 kg/m  Physical Exam Constitutional:      Appearance: She is well-developed.  HENT:     Nose: Congestion present.     Mouth/Throat:     Mouth: Mucous membranes are moist. Oral lesions present.     Pharynx: Oropharyngeal exudate present.  Cardiovascular:     Rate and Rhythm: Normal rate and regular rhythm.  Pulmonary:     Effort: Pulmonary effort is normal.      Breath sounds: Normal breath sounds.  Abdominal:     General: Bowel sounds are normal.     Palpations: Abdomen is soft.  Musculoskeletal:     Cervical back: Normal range of motion.  Skin:    General: Skin is warm and dry.  Neurological:     General: No focal deficit present.     Mental Status: She is alert and oriented to person, place, and time.  Psychiatric:        Mood and Affect: Mood normal.        Behavior: Behavior normal.       Assessment & Plan:   Patient seen with Dr. Jone Dauphin.  Viral URI Patient has never been formally diagnosed with COPD, but has recently had an exacerbation requiring a 5-day courses of azithromycin , prednisone , and an albuterol  inhaler earlier this month.  She does have a longstanding history of allergies and a 35-pack-year history of smoking that have culminated and triggered this previous exacerbation.  She has recently finished antibiotics, but has been using the albuterol  inhaler because her symptoms have not totally resolved guarding her shortness of breath.  She recently stopped the prednisone  without fully finishing the course given concerns over concomitant use while taking Wellbutrin  and Chantix . Sore throat 2 days ago, worse, hardly can swallow.  She endorses  a sore throat over the past 2 days that hurts when swallowing any foods or liquids, and even saliva.  She has some increased shortness of breath and cough, but denies any sputum production.  On physical exam she does have some oral thrush on the back of the tongue which is likely contributing to her sore throat.  Discussed with patient that she likely has both an URI as well as some slight thrush on the tongue.  Encouraged rinsing well using inhalers and supportive measures for her URI.  Will send nystatin  suspension for thrush and alleviation of symptoms.  Informed patient that if her symptoms do not improve then to return to the clinic next week for further management.  If symptoms do not  improve, consider CAP versus COPD exacerbation.  Patient will need PFTs and repeat chest x-ray to fully rule out infectious etiology versus pathophysiologic etiology. -- Nystatin  suspension, QID     Aquilla Voiles, DO Internal Medicine Resident, PGY-1 4:33 PM 10/24/2023

## 2023-10-25 NOTE — Progress Notes (Signed)
 Internal Medicine Clinic Attending  I was physically present during the key portions of the resident provided service and participated in the medical decision making of patient's management care. I reviewed pertinent patient test results.  The assessment, diagnosis, and plan were formulated together and I agree with the documentation in the resident's note.  Shawn Sick, MD

## 2023-10-28 ENCOUNTER — Ambulatory Visit: Payer: Self-pay | Admitting: Student

## 2023-11-05 ENCOUNTER — Ambulatory Visit: Payer: Self-pay

## 2023-11-05 NOTE — Telephone Encounter (Signed)
 FYI Only or Action Required?: Action required by provider: Med Request.  Patient was last seen in primary care on 10/24/2023 by Amilibia, Jaden, DO.  Called Nurse Triage reporting Lice.  Symptoms began yesterday.  Interventions attempted: Nothing.  Symptoms are: stable.  Triage Disposition: Home Care  Patient/caregiver understands and will follow disposition?: Yes Reason for Disposition  [1] New-onset head lice AND [2] usually respond to over-the-counter medicines (e.g., Nix) in community  Answer Assessment - Initial Assessment Questions With grandkids passed 2 days already diagnosed, they have treatment from doctor. Advised getting treatment started with something OTC, patient also [facility]     1. LICE APPEARANCE: Have you seen any lice? If Yes, ask: What do they look like?  Note: The correct answer is a gray bug that's 1/16 inch or 2 millimeters long.      Little black bugs   2. NITS APPEARANCE: Have you seen any eggs (nits) in the hair? If Yes, ask: What do they look like? Note: The correct answer is white or tan eggs attached to hair shafts.     Have not looked  3. ONSET: How long have the eggs been present?      This morning   4. ITCH: Is the scalp itchy? If Yes, ask: How bad is the itch?      Yes bad  5. RASH: Is there a rash? If Yes, ask: What does it look like?      Rash on the back of the neck, red and bumpy  6. TREATMENT:  What treatment have you tried? What happened?     No  Protocols used: Head Lice-A-AH  Copied from CRM [id number]. Topic: Clinical - Medication Question >> Nov 05, 2023  4:19 PM Merlynn A wrote: Reason for CRM: Patient called in requesting lice shampoo be sent to pharmacy for her. Patient stated she has lice+2 days. Patient is having itching on body but not that bad, More so in hair. Patient can be reached at [phone].

## 2023-11-06 MED ORDER — PYRETHRINS-PIPERONYL BUTOXIDE 0.33-4 % EX SHAM: MEDICATED_SHAMPOO | Freq: Once | CUTANEOUS | 0 refills | Status: AC

## 2023-11-06 NOTE — Addendum Note (Signed)
 Addended by: ELICIA SHARPER on: 11/06/2023 05:16 PM   Modules accepted: Orders

## 2023-11-07 NOTE — Telephone Encounter (Signed)
 Called pt about rx , no answer. Unable to leave a message, mailbox is full.

## 2023-12-09 ENCOUNTER — Ambulatory Visit: Payer: Self-pay

## 2023-12-09 NOTE — Telephone Encounter (Signed)
 Patient to call back to schedule. Needs late afternoon appt with IMP and has to check with boss fro coverage.

## 2023-12-09 NOTE — Telephone Encounter (Signed)
 This RN contacted patient. She stated that her schedule at work is not yet posted. Will call in the AM to schedule visit. Requested antibiotic without being seen, this RN advised that it is important that the cause of the synmptoms  be diagnosed as the treatments for different causes vary. Pt verbalized understanding.

## 2023-12-09 NOTE — Telephone Encounter (Signed)
 FYI Only or Action Required?: FYI only for provider.  Patient was last seen in primary care on 10/24/2023 by Amilibia, Jaden, DO.  Called Nurse Triage reporting Abdominal Cramping.  Symptoms began several weeks ago.  Interventions attempted: Rest, hydration, or home remedies.  Symptoms are: gradually worsening.  Triage Disposition: See Physician Within 24 Hours, See PCP When Office is Open (Within 3 Days)  Patient/caregiver understands and will follow disposition?: Yes  Message from [facility] C sent at 12/09/2023 11:26 AM EDT  Reason for Triage: Patient would like to speak to a nurse regarding a condition she might have in her genital area even when she's not sexually active. She would prefer giving more information to nurse instead and believes it could be a trigger due to medication she's taking   Reason for Disposition  [1] Vaginal odor (bad smell) AND [2] not improved > 3 days following Care Advice  [1] MODERATE pain (e.g., interferes with normal activities) AND [2] pain comes and goes (cramps) AND [3] present > 24 hours  (Exception: Pain with Vomiting or Diarrhea - see that Guideline.)  Answer Assessment - Initial Assessment Questions Pt with vaginal odor for 2 weeks, hoping it would just go away but hasn't. Pt going through menopause, not sexually active, denies douching, denies any penetration. Thin clear discharge and smells when she wipes. Denies itching, or pain. Some abdominal pain cramping intermittently.    Increased stress, has forgone undergarments a couple days and unsure if contributing.   1. SYMPTOM: What's the main symptom you're concerned about? (e.g., pain, itching, dryness)     Vaginal odor 2. LOCATION: Where is the  odor located? (e.g., inside/outside, left/right)     When she wipes vagina 3. ONSET: When did the  odor  start?     About 2 weeks but thought it would go away  4. PAIN: Is there any pain? If Yes, ask: How bad is it? (Scale: 1-10; mild,  moderate, severe)     denies 5. ITCHING: Is there any itching? If Yes, ask: How bad is it? (Scale: 1-10; mild, moderate, severe)     denies 6. CAUSE: What do you think is causing the discharge? Have you had the same problem before? What happened then?     Thin clear discharge that smells 7. OTHER SYMPTOMS: Do you have any other symptoms? (e.g., fever, itching, vaginal bleeding, pain with urination, injury to genital area, vaginal foreign body)     Denies  Answer Assessment - Initial Assessment Questions Pt having intermittent abdominal pain lasting anywhere from 30seconds to 3-28minutes. Sometimes center lower abdomen, sometimes RLQ, ULQ below left breast and took ASA a few days ago. She has increased stress recently. Feels like she needs to have BM but cannot.   1. LOCATION: Where does it hurt?     Lower abdomin, sometimes center sometimes Right  2. RADIATION: Does the pain shoot anywhere else? (e.g., chest, back)     Denies  3. ONSET: When did the pain begin? (e.g., minutes, hours or days ago)      Worsening over last few days  4. SUDDEN: Gradual or sudden onset?     Sudden  5. PATTERN Does the pain come and go, or is it constant?     Intermittent. Having to lift client more 6. SEVERITY: How bad is the pain?  (e.g., Scale 1-10; mild, moderate, or severe)     4/10 sharp shooting pain 7. RECURRENT SYMPTOM: Have you ever had this type of stomach pain  before? If Yes, ask: When was the last time? and What happened that time?      Off and on for the last few days  8. CAUSE: What do you think is causing the stomach pain? (e.g., gallstones, recent abdominal surgery)     unsure 9. RELIEVING/AGGRAVATING FACTORS: What makes it better or worse? (e.g., antacids, bending or twisting motion, bowel movement)     Last night cramped center lower belly- woke her- felt like she had to have BM but couldnt- spasmy type cramp.  10. OTHER SYMPTOMS: Do you have any other  symptoms? (e.g., back pain, diarrhea, fever, urination pain, vomiting)       Vaginal odor  Protocols used: Vaginal Symptoms-A-AH, Abdominal Pain - Female-A-AH

## 2024-01-15 ENCOUNTER — Ambulatory Visit: Payer: Self-pay

## 2024-01-15 ENCOUNTER — Ambulatory Visit: Admitting: Student

## 2024-01-15 NOTE — Telephone Encounter (Signed)
 FYI Only or Action Required?: Action required by provider: update on patient condition. Requesting medication for sinus infection- Pharmacy confirmed  Patient was last seen in primary care on 10/24/2023 by Amilibia, Jaden, DO.  Called Nurse Triage reporting Facial Pain.  Symptoms began a week ago.  Interventions attempted: OTC medications: BC Goody powders and Rest, hydration, or home remedies.  Symptoms are: gradually worsening.  Triage Disposition: No disposition on file.  Patient/caregiver understands and will follow disposition?:   Copied from CRM [id number]. Topic: Clinical - Red Word Triage >> Jan 15, 2024  4:15 PM Alfonso ORN wrote: Red Word that prompted transfer to Nurse Triage:  have real bad sinus infection and having real bad headache rate pain 8 , and nose is real stuffy and dry Answer Assessment - Initial Assessment Questions 1 week of sinus pain, headache, nasal congestion, mild cough, yellow phlegm in the AM. Thought it was allergies but has not resolved. BC goody powders for sinus pain/headache- not helping much Offered appt tomorrow but unable to make available times due to work. Will look into UC. Wait times provided.  She is asking if something can be called in to her pharmacy to help with sinus infection. Pharmacy confirmed. Advised I cannot guarantee. She understand and will go to UC if she needs to.   ED/UC precautions advised and understood  1. LOCATION: Where does it hurt?      Face hurts, behind her eyes and back of her head 2. ONSET: When did the sinus pain start?  (e.g., hours, days)      About a week  3. SEVERITY: How bad is the pain?   (Scale 0-10; or none, mild, moderate or severe)     8/10 4. RECURRENT SYMPTOM: Have you ever had sinus problems before? If Yes, ask: When was the last time? and What happened that time?      allergies 5. NASAL CONGESTION: Is the nose blocked? If Yes, ask: Can you open it or must you breathe through your mouth?      Both blocked- not much coming out  6. NASAL DISCHARGE: Do you have discharge from your nose? If so ask, What color?     Yellow in the morning but nothing really comes out during the day  7. FEVER: Do you have a fever? If Yes, ask: What is it, how was it measured, and when did it start?      denies 8. OTHER SYMPTOMS: Do you have any other symptoms? (e.g., sore throat, cough, earache, difficulty breathing)     Mild cough, ear congestion, itchy throat  Protocols used: Sinus Pain or Congestion-A-AH

## 2024-02-04 ENCOUNTER — Telehealth: Payer: Self-pay | Admitting: *Deleted

## 2024-02-04 NOTE — Telephone Encounter (Signed)
 Called pt who stated she went to UC; she was given eye drops and rx for allergy medication. Stated eyes are still red; she's stuffy. No appts today; offered appt tomorrow . Appt scheduled tomoorow 12.24 with Dr Elnora.

## 2024-02-04 NOTE — Telephone Encounter (Signed)
 Copied from CRM #[id number]. Topic: Clinical - Medical Advice >> Feb 03, 2024  4:19 PM Graeme ORN wrote: Reason for CRM: Patient called. States she was seen recently (last week) at a walk in clinic. Was given medication but is not feeling any better. States eye are red and itchy all around and nose is stuff - sinus concerns. Would like to know what she should do. If she she go back to walking clinic. Thank You

## 2024-02-05 ENCOUNTER — Ambulatory Visit

## 2024-02-05 ENCOUNTER — Ambulatory Visit: Payer: Self-pay | Admitting: Student

## 2024-02-05 NOTE — Progress Notes (Deleted)
 "  Patient name: Rimsha Tally Date of birth: 25-Aug-1970 Date of visit: 02/05/2024  Type of visit: Established Patient Office Visit   Subjective   Chief concern: No chief complaint on file.   Trica Broman is a 53 y.o. female with a history of COPD, tobacco use disorder, prior Hep C, migraines who presents to [facility] urgent care follow up.  Patient presented to urgent care on 01/25/2024 after she woke up with swollen eyes, and right eye slightly more swollen and irritated compared to left.  At that time, was felt to be allergic conjunctivitis, and she was given Flonase , olopatadine eyedrops, and Zyrtec 10 mg daily.  ***ROS: Denies headaches, dizziness, fever, chills, runny nose, sore throat, vision changes, hearing changes, chest pain, shortness of breath, difficulty breathing, nausea, vomiting, abdominal pain. Denies increased urinary frequency, pain with urination, constipation or diarrhea. No recent falls.   Patient Active Problem List   Diagnosis Date Noted   COPD exacerbation (HCC) 10/16/2023   Seborrheic dermatitis 06/14/2023   Pre-employment health screening examination 06/07/2023   Elevated blood pressure reading 03/15/2023   Dyspepsia 03/15/2023   Breast pain, left 09/17/2022   Migraine 09/17/2022   History of abnormal cervical Pap smear 09/17/2022   Routine screening for STI (sexually transmitted infection) 05/12/2021   Encounter for health-related screening 05/12/2021   Fatigue 10/26/2020   Skin lesions, generalized 08/21/2019   Tobacco abuse 03/27/2016   Anxiety and depression 03/18/2016   History of hepatitis C      Past Surgical History:  Procedure Laterality Date   AUGMENTATION MAMMAPLASTY Bilateral    BREAST ENHANCEMENT SURGERY     CESAREAN SECTION     MYRINGOTOMY       Current Outpatient Medications  Medication Instructions   albuterol  (VENTOLIN  HFA) 108 (90 Base) MCG/ACT inhaler 1-2 puffs, Inhalation, Every 6 hours PRN   azithromycin  (ZITHROMAX ) 250 MG  tablet Take 1 tablet daily.   buPROPion  (WELLBUTRIN  SR) 150 MG 12 hr tablet Take 1 tablet daily for three days, then 1 tablet twice daily until finished   doxycycline  (VIBRAMYCIN ) 100 mg, Oral, 2 times daily   fluticasone  (FLONASE ) 50 MCG/ACT nasal spray 2 sprays, Each Nare, Daily   hydrocortisone  2.5 % cream Topical, 2 times daily   ibuprofen  (ADVIL ) 800 mg, Every 8 hours PRN   ketoconazole  (NIZORAL ) 2 % shampoo WASH YOUR SCALP 2-3 TIMES A WEEK   nicotine  polacrilex (NICORETTE ) 4 mg, Oral, As needed   nystatin  (MYCOSTATIN ) 500,000 Units, Oral, 4 times daily   OVER THE COUNTER MEDICATION 1 packet, Daily PRN   oxyCODONE  (OXY IR/ROXICODONE ) 5 mg, Oral, Every 6 hours PRN   predniSONE  (DELTASONE ) 20 MG tablet Take 2 tablets daily with breakfast.   triamcinolone  (KENALOG ) 0.025 % ointment 1 Application, Topical, 2 times daily    Social History[1]    Objective  There were no vitals filed for this visit.There is no height or weight on file to calculate BMI.   Physical Exam:   Constitutional: well-appearing *** sitting in exam chair, in no acute distress. Ambulates without use of assistance device *** HEENT: normocephalic atraumatic, mucous membranes moist Eyes: conjunctiva non-erythematous Neck: supple Cardiovascular: regular rate and rhythm, bilateral radial pulses 2+, bilateral dorsal pedal pulses 2+, brisk capillary refill bilateral feet and hands  Pulmonary/Chest: normal work of breathing on room air, lungs clear to auscultation bilaterally Abdominal: soft, non-tender, non-distended MSK: normal bulk and tone. Neurological: alert & oriented x 3, sensation intact bilateral feet to monofilament*** Skin: warm and  dry, no ulcers or lesions on bilateral feet*** Psych: mood calm, behavior normal, thought content normal, judgement normal    {Labs (Optional):23779}  The ASCVD Risk score (Arnett DK, et al., 2019) failed to calculate for the following reasons:   Cannot find a previous HDL  lab   Cannot find a previous total cholesterol lab   * - Cholesterol units were assumed      Assessment & Plan  Problem List Items Addressed This Visit   None   No follow-ups on file.  Patient discussed with Dr. {imcattendings:[id number]}, who also saw and evaluated the patient.  Doyal Miyamoto, MD [facility]  PGY-1 02/05/2024, 8:15 AM      [1]  Social History Tobacco Use   Smoking status: Every Day    Current packs/day: 0.50    Average packs/day: 0.5 packs/day for 30.0 years (15.0 ttl pk-yrs)    Types: Cigarettes   Smokeless tobacco: Never   Tobacco comments:    0.5 PPD  Vaping Use   Vaping status: Never Used  Substance Use Topics   Alcohol use: No   Drug use: Not Currently    Types: Marijuana   "

## 2024-02-11 ENCOUNTER — Ambulatory Visit: Payer: Self-pay | Admitting: Student

## 2024-02-25 ENCOUNTER — Ambulatory Visit: Admitting: Student

## 2024-03-02 ENCOUNTER — Ambulatory Visit

## 2024-03-02 VITALS — BP 130/95 | HR 83 | Temp 98.2°F | Ht 62.0 in | Wt 147.8 lb

## 2024-03-02 DIAGNOSIS — L989 Disorder of the skin and subcutaneous tissue, unspecified: Secondary | ICD-10-CM | POA: Diagnosis not present

## 2024-03-02 DIAGNOSIS — F32A Depression, unspecified: Secondary | ICD-10-CM | POA: Diagnosis not present

## 2024-03-02 DIAGNOSIS — F17200 Nicotine dependence, unspecified, uncomplicated: Secondary | ICD-10-CM

## 2024-03-02 DIAGNOSIS — F419 Anxiety disorder, unspecified: Secondary | ICD-10-CM | POA: Diagnosis present

## 2024-03-02 DIAGNOSIS — F17210 Nicotine dependence, cigarettes, uncomplicated: Secondary | ICD-10-CM | POA: Diagnosis not present

## 2024-03-02 DIAGNOSIS — Z0000 Encounter for general adult medical examination without abnormal findings: Secondary | ICD-10-CM

## 2024-03-02 DIAGNOSIS — Z720 Tobacco use: Secondary | ICD-10-CM

## 2024-03-02 MED ORDER — PERMETHRIN 5 % EX CREA: 1.0000 | TOPICAL_CREAM | Freq: Once | CUTANEOUS | 0 refills | Status: AC

## 2024-03-02 MED ORDER — VARENICLINE TARTRATE (STARTER) 0.5 MG X 11 & 1 MG X 42 PO TBPK: ORAL_TABLET | ORAL | 1 refills | Status: AC

## 2024-03-02 MED ORDER — BUPROPION HCL ER (SR) 150 MG PO TB12: ORAL_TABLET | ORAL | 2 refills | Status: AC

## 2024-03-02 NOTE — Patient Instructions (Signed)
 Today we discussed the following medical conditions and plan:   We will check your b12 levels today  I have also sent in the prescriptions for your cream. Apply the cream once to your body and keep it on for 8-14 hours and then wash it off  I have sent in chantix  and wellbutrin . Please follow the instructions for the chantix . Let us  know if you have any changes in mood   We look forward to seeing you next time. Please call our clinic at [phone] if you have any questions or concerns. The best time to call is Monday-Friday from 9am-4pm, but there is someone available 24/7. If you need medication refills, please notify your pharmacy one week in advance and they will send us  a request.   Thank you for trusting me with your care. Wishing you the best!   Hymera Grosser D'Mello, DO  [facility]

## 2024-03-02 NOTE — Progress Notes (Unsigned)
" ° °  Established Patient Office Visit  Subjective   Patient ID: Loni Delbridge, female    DOB: 16-Dec-1970  Age: 54 y.o. MRN: [id number]  Chief Complaint  Patient presents with   3 month follow    HPI Patient is a 54 year old female with PMH of Anxiety, unconfirmed COPD, hepatitis C, migraine, Tobacco abuse that presents today for follow up of chronic conditions.  {History (Optional):23778}  ROS    Objective:     Ht 5' 2 (1.575 m)   Wt 147 lb 12.8 oz (67 kg)   LMP  (LMP Unknown)   BMI 27.03 kg/m  {Vitals History (Optional):23777}  Physical Exam   No results found for any visits on 03/02/24.  {Labs (Optional):23779}  The ASCVD Risk score (Arnett DK, et al., 2019) failed to calculate for the following reasons:   Cannot find a previous HDL lab   Cannot find a previous total cholesterol lab   * - Cholesterol units were assumed    Assessment & Plan:   Problem List Items Addressed This Visit   None   No follow-ups on file.    Rohaan Durnil D'Mello, DO  "

## 2024-03-03 ENCOUNTER — Ambulatory Visit: Payer: Self-pay

## 2024-03-03 DIAGNOSIS — Z0000 Encounter for general adult medical examination without abnormal findings: Secondary | ICD-10-CM | POA: Insufficient documentation

## 2024-03-03 LAB — VITAMIN B12: Vitamin B-12: 1985 pg/mL — ABNORMAL HIGH (ref 232–1245)

## 2024-03-03 NOTE — Assessment & Plan Note (Signed)
 Patient states that she wanted to have B12 shot. States that she had them previously. 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

## 2024-03-03 NOTE — Assessment & Plan Note (Signed)
 Patient states that this has been going on for the last 2 years. She states that she has been using hydrocortisone  cream and triamcinolone . She has not noticed any bugs on her but feels like they could be crawling on her.She endorses that daughter's dog may have fleas. She denies using any other drugs except tobacco. Did explain that steroid creams long term can cause skin breakdown. On exam no lesions noted in between fingers but seem to be generalized over back, abdomen and arms, some on legs. Patient noticed improvement with permethrin  cream before.  Plan: Follow up with scheduled Dermatology appointment Stop triamcinolone  Apply permethrin  cream  Encouraged continued hygiene practices for home.

## 2024-03-03 NOTE — Assessment & Plan Note (Signed)
 Patient states that she had quit smoking for about 2 months. She was on chantix  and wellbutrin  at that time but states she has been off of these medications in the last 2 weeks due to thinking she could quit on her own. She states that she would like to go back on these medications. She is smoking 4 cigarettes a day, unless she is more anxious and then smokes 6-7. Anxiety is a big trigger for her smoking  Plan: Given Chantix  starter pack Restarted wellbutrin  150 mg

## 2024-03-03 NOTE — Assessment & Plan Note (Addendum)
 GAD was 12 at today's visit. Patient reports having lots of family stress. Offered referral to [facility] but patient declined.  Plan: Continue bupropion  150 mg 12 hours tablet

## 2024-03-04 NOTE — Progress Notes (Signed)
 Internal Medicine Clinic Attending  Case discussed with the resident at the time of the visit.  We reviewed the resident's history and exam and pertinent patient test results.  I agree with the assessment, diagnosis, and plan of care documented in the resident's note.

## 2024-04-08 ENCOUNTER — Ambulatory Visit: Admitting: Dermatology
